# Patient Record
Sex: Female | Born: 1960 | Hispanic: Yes | Smoking: Never smoker
Health system: Southern US, Community
[De-identification: ages and names within clinical notes are randomized; demographics above are authoritative.]

## PROBLEM LIST (undated history)

## (undated) DIAGNOSIS — E119 Type 2 diabetes mellitus without complications: Secondary | ICD-10-CM

## (undated) DIAGNOSIS — I10 Essential (primary) hypertension: Secondary | ICD-10-CM

## (undated) DIAGNOSIS — E785 Hyperlipidemia, unspecified: Secondary | ICD-10-CM

## (undated) DIAGNOSIS — E079 Disorder of thyroid, unspecified: Secondary | ICD-10-CM

## (undated) HISTORY — DX: Disorder of thyroid, unspecified: E07.9

## (undated) HISTORY — DX: Type 2 diabetes mellitus without complications: E11.9

## (undated) HISTORY — DX: Hyperlipidemia, unspecified: E78.5

## (undated) HISTORY — PX: KNEE ARTHROSCOPY: SUR90

## (undated) HISTORY — DX: Essential (primary) hypertension: I10

---

## 2020-05-23 ENCOUNTER — Other Ambulatory Visit: Payer: Self-pay | Admitting: Obstetrics and Gynecology

## 2020-05-23 DIAGNOSIS — Z1231 Encounter for screening mammogram for malignant neoplasm of breast: Secondary | ICD-10-CM

## 2020-06-21 ENCOUNTER — Ambulatory Visit: Payer: Self-pay

## 2020-08-02 ENCOUNTER — Other Ambulatory Visit: Payer: Self-pay | Admitting: *Deleted

## 2020-08-02 ENCOUNTER — Ambulatory Visit: Payer: Self-pay | Admitting: *Deleted

## 2020-08-02 ENCOUNTER — Ambulatory Visit
Admission: RE | Admit: 2020-08-02 | Discharge: 2020-08-02 | Disposition: A | Discharge status: Home / Self Care | Payer: No Typology Code available for payment source | Source: Ambulatory Visit | Attending: Obstetrics and Gynecology | Admitting: Obstetrics and Gynecology

## 2020-08-02 ENCOUNTER — Other Ambulatory Visit: Payer: Self-pay

## 2020-08-02 VITALS — BP 158/98 | Wt 231.1 lb

## 2020-08-02 DIAGNOSIS — Z1231 Encounter for screening mammogram for malignant neoplasm of breast: Secondary | ICD-10-CM

## 2020-08-02 DIAGNOSIS — Z1239 Encounter for other screening for malignant neoplasm of breast: Secondary | ICD-10-CM

## 2020-08-02 DIAGNOSIS — Z124 Encounter for screening for malignant neoplasm of cervix: Secondary | ICD-10-CM

## 2020-08-02 MED ORDER — FLUCONAZOLE 150 MG PO TABS: 150.0000 mg | ORAL_TABLET | ORAL | 0 refills | Status: DC

## 2020-08-02 MED ORDER — NYSTATIN-TRIAMCINOLONE 100000-0.1 UNIT/GM-% EX OINT: 1.0000 "application " | TOPICAL_OINTMENT | Freq: Two times a day (BID) | CUTANEOUS | 0 refills | Status: DC

## 2020-08-02 NOTE — Addendum Note (Signed)
Addended by: Narda Rutherford on: 08/02/2020 02:52 PM   Modules accepted: Orders

## 2020-08-02 NOTE — Patient Instructions (Addendum)
Explained breast self awareness with Ranelle Oyster. Pap smear completed. Let her know [facility] will cover Pap smears and HPV typing every 5 years unless has a history of abnormal Pap smears. Referred patient to the [facility] for a screening mammogram on mobile unit. Appointment scheduled Thursday, Aug 02, 2020 at 1440. Patient escorted to the mobile unit following B[facility]CP appointment for her screening mammogram. Let patient know will follow up with her within the next week with results of her Pap smear and wet prep by phone. Referred patient to the [facility] program due to elevated BP and history of diabetes that patient has need seen a provider in the past three years. Informed patient that the Breast Center will follow-up with her within the next couple of weeks with results of her mammogram by letter or phone. Ranelle Oyster verbalized understanding.  Milledge Gerding, Kathaleen Maser, RN 1:52 PM

## 2020-08-02 NOTE — Progress Notes (Addendum)
Christina Norton is a 60 y.o. No obstetric history on file. female who presents to [facility] clinic today with complaint of redness and itching under bilateral breasts.    Pap Smear: Pap smear completed today. Last Pap smear was in 2018 at a clinic in [place] and was normal per patient. 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 Patient stated she had a colposcopy and cryotherapy completed for follow-up in [place]. Patient stated that she has had at least three normal Pap smears since cryotherapy. Last Pap smear result is not available in Epic.   Physical exam: Breasts Breasts symmetrical. Redness and yeast bilateral lower breasts and axilla that is greater within the right lower breast. Prescription for Nystatin and Diflucin sent to pharmacy to treat yeast. No nipple retraction bilateral breasts. No nipple discharge bilateral breasts. No lymphadenopathy. No lumps palpated bilateral breasts. No complaints of pain or tenderness on exam.      Pelvic/Bimanual Ext Genitalia Redness and yeast observed external genitalia, no swelling and urine observed on external genitalia.        Vagina Vagina reddened bleeds easily. No lesions and thick yellowish/whitish discharge observed in vagina. Wet prep completed.       Cervix Cervix is present. Cervix pink and of normal texture. No discharge observed.    Uterus Uterus is present and palpable. Uterus in normal position and normal size.        Adnexae Bilateral ovaries present and palpable. No tenderness on palpation.         Rectovaginal No rectal exam completed today since patient had no rectal complaints. No skin abnormalities observed on exam.     Smoking History: Patient has never smoked.   Patient Navigation: Patient education provided. Access to services provided for patient through [facility]. Spanish interpreter Natale Lay from [facility] provided.   Colorectal Cancer  Screening: Per patient has never had colonoscopy completed. No complaints today.    Breast and Cervical Cancer Risk Assessment: Patient does not have family history of breast cancer, known genetic mutations, or radiation treatment to the chest before age 53. Per patient has history of cervical dysplasia. Patient has no history of being immunocompromised or DES exposure in-utero.  Risk Assessment    Risk Scores      08/02/2020   Last edited by: Priscille Heidelberg, RN   5-year risk: 1 %   Lifetime risk: 6 %          A: B[facility]CP exam with pap smear No complaints.  P: Referred patient to the [facility] of [facility] for a screening mammogram on mobile unit. Appointment scheduled Thursday, Aug 02, 2020 at 1440.   Priscille Heidelberg, RN 08/02/2020 1:51 PM

## 2020-08-03 ENCOUNTER — Telehealth: Payer: Self-pay

## 2020-08-03 ENCOUNTER — Other Ambulatory Visit: Payer: Self-pay

## 2020-08-03 LAB — CERVICOVAGINAL ANCILLARY ONLY
Bacterial Vaginitis (gardnerella): POSITIVE — AB
Candida Glabrata: NEGATIVE
Candida Vaginitis: NEGATIVE
Comment: NEGATIVE
Comment: NEGATIVE
Comment: NEGATIVE
Comment: NEGATIVE
Trichomonas: NEGATIVE

## 2020-08-03 MED ORDER — METRONIDAZOLE 500 MG PO TABS: 500.0000 mg | ORAL_TABLET | Freq: Two times a day (BID) | ORAL | 0 refills | Status: DC

## 2020-08-03 NOTE — Telephone Encounter (Signed)
Via Delorise Royals, Spanish Interpreter [facility]), Patient informed wet prep results, positive for Bacterial Vaginitis, needs to rx metronidazole, 1 po bid x 1 week. Patient verbalized understanding. Rx sent to pharmacy [facility]

## 2020-08-07 ENCOUNTER — Other Ambulatory Visit: Payer: Self-pay | Admitting: Obstetrics and Gynecology

## 2020-08-07 DIAGNOSIS — R928 Other abnormal and inconclusive findings on diagnostic imaging of breast: Secondary | ICD-10-CM

## 2020-08-07 LAB — CYTOLOGY - PAP
Comment: NEGATIVE
Diagnosis: NEGATIVE
High risk HPV: NEGATIVE

## 2020-08-23 ENCOUNTER — Other Ambulatory Visit: Payer: Self-pay

## 2020-08-23 ENCOUNTER — Ambulatory Visit
Admission: RE | Admit: 2020-08-23 | Discharge: 2020-08-23 | Disposition: A | Discharge status: Home / Self Care | Payer: No Typology Code available for payment source | Source: Ambulatory Visit | Attending: Obstetrics and Gynecology | Admitting: Obstetrics and Gynecology

## 2020-08-23 ENCOUNTER — Other Ambulatory Visit: Payer: Self-pay | Admitting: Obstetrics and Gynecology

## 2020-08-23 DIAGNOSIS — R928 Other abnormal and inconclusive findings on diagnostic imaging of breast: Secondary | ICD-10-CM

## 2020-08-23 DIAGNOSIS — N6489 Other specified disorders of breast: Secondary | ICD-10-CM

## 2020-08-29 ENCOUNTER — Other Ambulatory Visit: Payer: Self-pay

## 2020-08-29 ENCOUNTER — Inpatient Hospital Stay: Payer: Self-pay | Attending: Obstetrics and Gynecology | Admitting: *Deleted

## 2020-08-29 VITALS — BP 160/111 | Ht 65.0 in | Wt 229.2 lb

## 2020-08-29 DIAGNOSIS — Z0000 Encounter for general adult medical examination without abnormal findings: Secondary | ICD-10-CM

## 2020-08-29 NOTE — Progress Notes (Signed)
Wisewoman initial screening   Interpreter- Natale Lay, [place]   Clinical Measurement:  Vitals:   08/29/20 0835 08/29/20 1048  BP: (!) 164/110 (!) 160/111   Fasting Labs Drawn Today, will review with patient when they result.   Medical History:  Patient states that she has high cholesterol, has high blood pressure and she has diabetes.  Medications:  Patient states that she does not take medication to lower cholesterol, blood pressure or blood sugar.  Patient does not take an aspirin a day to help prevent a heart attack or stroke.    Blood pressure, self measurement: Patient states that she does not measure blood pressure from home. She checks her blood pressure N/A. She shares her readings with a health care provider: N/A.   Nutrition: Patient states that on average she eats 1 cups of fruit and 1 cups of vegetables per day. Patient states that she does not eat fish at least 2 times per week. Patient eats less than half servings of whole grains. Patient drinks less than 36 ounces of beverages with added sugar weekly: yes. Patient is currently watching sodium or salt intake: yes. In the past 7 days patient has consumed drinks containing alcohol on 1 days. On a day that patient consumes drinks containing alcohol on average 2 drinks are consumed.      Physical activity:  Patient states that she gets 0 minutes of moderate and 0 minutes of vigorous physical activity each week.  Smoking status:  Patient states that she has has never smoked .   Quality of life:  Over the past 2 weeks patient states that she had little interest or pleasure in doing things: several days. She has been feeling down, depressed or hopeless:not at all.    Risk reduction and counseling:   Heart Wise: Explained Heart Wise program to patient. Obtained consent form. Showed patient how to log BP on tracking sheets. Showed patient how to use blood pressure monitor. Patient then demonstrated how to check blood pressure. Gave  patient educational brochures on hypertension.   Health Coaching: Spoke with patient about daily recommendations for fruits and vegetables. Explained to patient what heart healthy fish are and whole grains and how they can help lower cholesterol levels. Patient has not been exercising regularly. Encouraged patient to exercise when she can. She is currently having some issues with her knees. Spoke with patient about low-intensity workouts that she can do that will not put too much strain on her knees.    Navigation:  I will notify patient of lab results.  Patient is aware of 2 more health coaching sessions and a follow up. Will cal patient with follow-up appointment information for Internal Medicine once appointment is scheduled for elevated BP.  Time: 30 minutes

## 2020-08-30 ENCOUNTER — Telehealth: Payer: Self-pay

## 2020-08-30 LAB — GLUCOSE, RANDOM: Glucose: 291 mg/dL — ABNORMAL HIGH (ref 65–99)

## 2020-08-30 LAB — LIPID PANEL
Chol/HDL Ratio: 5.5 ratio — ABNORMAL HIGH (ref 0.0–4.4)
Cholesterol, Total: 238 mg/dL — ABNORMAL HIGH (ref 100–199)
HDL: 43 mg/dL (ref >39)
LDL Chol Calc (NIH): 157 mg/dL — ABNORMAL HIGH (ref 0–99)
Triglycerides: 205 mg/dL — ABNORMAL HIGH (ref 0–149)
VLDL Cholesterol Cal: 38 mg/dL (ref 5–40)

## 2020-08-30 LAB — HEMOGLOBIN A1C
Est. average glucose Bld gHb Est-mCnc: 306 mg/dL
Hgb A1c MFr Bld: 12.3 % — ABNORMAL HIGH (ref 4.8–5.6)

## 2020-08-30 NOTE — Progress Notes (Signed)
I have sent over a referral to Internal Medicine to get her scheduled for follow-up appointment. I will call the patient once the appointment is scheduled. Thanks!

## 2020-08-30 NOTE — Telephone Encounter (Signed)
Via Joslyn Hy, Patient informed negative Pap/HPV results, next pap due in 3 years. Patient verbalized understanding.

## 2020-09-05 ENCOUNTER — Telehealth: Payer: Self-pay

## 2020-09-05 NOTE — Telephone Encounter (Signed)
Health coaching 2   interpreter- [address] [employer], [phone]   Labs-238 cholesterol, 157 LDL cholesterol, 205 triglycerides, 43 HDL cholesterol, 12.3 hemoglobin A1C, 291 mean plasma glucose.  Patient understands and is aware of her lab results.   Goals- Spoke in detail with patient about lab results and answered any questions that the patient had regarding results.  Heart Wise: Checked in with patient about blood pressure readings. Patient has been taking daily readings. Patient stated that BP is still elevated in the 140's-160's. Told patient to take BP tracking logs with her to follow-up appointment for the provider to review.   Health Coaching: Reduce the amount of fried and fatty foods consumed. Try to grill, bake, broil or sautee foods instead. Reduce the amount of red meats consumed. Substitute for lean proteins like chicken or fish. Encouraged patient to try and add more whole grains and heart healthy fish in diet. Reduce the amount of sweets and sugars consumed. Reduce the amount of carbs consumed. Encouraged patient to practice diabetic diet (explained to patient what this means). Patient has been walking for 30 minutes daily since initial visit. Encouraged her to continue with daily walking.    Navigation:  Patient is aware of 1 more health coaching sessions and a follow up. Will call patient with follow-up appointment information once appointment is scheduled with Internal Medicine (currently waiting on new resident schedule for July).  Time- 23 minutes

## 2020-09-17 ENCOUNTER — Ambulatory Visit (INDEPENDENT_AMBULATORY_CARE_PROVIDER_SITE_OTHER): Payer: Self-pay | Admitting: Internal Medicine

## 2020-09-17 ENCOUNTER — Encounter: Payer: Self-pay | Admitting: Internal Medicine

## 2020-09-17 ENCOUNTER — Other Ambulatory Visit: Payer: Self-pay

## 2020-09-17 VITALS — BP 155/77 | HR 92 | Temp 98.2°F | Ht 65.0 in | Wt 230.6 lb

## 2020-09-17 DIAGNOSIS — E039 Hypothyroidism, unspecified: Secondary | ICD-10-CM

## 2020-09-17 DIAGNOSIS — E785 Hyperlipidemia, unspecified: Secondary | ICD-10-CM

## 2020-09-17 DIAGNOSIS — I10 Essential (primary) hypertension: Secondary | ICD-10-CM

## 2020-09-17 DIAGNOSIS — E119 Type 2 diabetes mellitus without complications: Secondary | ICD-10-CM

## 2020-09-17 MED ORDER — ATORVASTATIN CALCIUM 20 MG PO TABS: 20.0000 mg | ORAL_TABLET | Freq: Every day | ORAL | 2 refills | Status: DC

## 2020-09-17 MED ORDER — LISINOPRIL-HYDROCHLOROTHIAZIDE 20-12.5 MG PO TABS: 1.0000 | ORAL_TABLET | Freq: Every day | ORAL | 2 refills | Status: DC

## 2020-09-17 MED ORDER — METFORMIN HCL 500 MG PO TABS: ORAL_TABLET | ORAL | 0 refills | Status: DC

## 2020-09-17 NOTE — Patient Instructions (Signed)
Gracias por visitar la [employer] [facility] hoy. Fue Psychiatrist conocerte!  Hoy discutimos su diabetes, presin arterial alta y colesterol alto. Tambin discutimos su hipotiroidismo. 1. Diabetes: Comenzar a tomar metformina a una tasa titulada:  un. Semana 1: 500 mg una vez al da  b. Semana 2: 500 mg dos veces al da  c. Semana 3: 1000 mg dos veces al da 2. Presin arterial alta: Se ha enviado un medicamento llamado lisinopril-hidroclorotiazida. Lo tomar Pollyann Savoy al da. 3. Colesterol alto: Se ha enviado un medicamento llamado atorvastatina. Lo tomar Pollyann Savoy al da. 4. Hipotiroidismo: Antes de enviar medicamentos para su tiroides, me gustara recibir [employer] de su anlisis de sangre realizado en su visita de hoy. Una vez que me los [facility], lo llamar y le har saber cules son los prximos pasos en la gestin.   Me gustara verlo de vuelta en la clnica en un mes para asegurarme de que pudo obtener estos medicamentos y que est bien con ellos.  Por favor, no dude en ponerse en contacto con la clnica con cualquier pregunta o inquietud! Dra. August Saucer

## 2020-09-17 NOTE — Assessment & Plan Note (Addendum)
Labs 06/22/22022 showed: Total cholesterol 238 Triglycerides 205 HDL 43 VLDL 38 LDL 157 Total cholesterol/HDL 5.5  ASCVD risk calculated to be 10.3%. - Begin atorvastatin 20 mg by mouth once daily. - Follow-up in one month.

## 2020-09-17 NOTE — Assessment & Plan Note (Addendum)
Patient reports past treatment for hypothyroidism with levothyroxine 25 mcg.  - TSH ordered - Will send prescription for levothyroxine pending lab results  Addendum: TSH is WNL. Will send in prescription for patient's previous dose of levothyroxine 25 mcg.

## 2020-09-17 NOTE — Progress Notes (Signed)
Patient ID: Christina Norton, female   DOB: 1960/04/17, 60 y.o.   MRN: [id number]   CC: follow-up for abnormal labs from [facility] Woman visit  HPI:  [name] is a 60 y.o. female with a past medical history of diabetes mellitus, hypertension, and thyroid disease presenting for follow-up from [facility] appointment.  The patient states that she has been treated in the past for diabetes and that this began when she was living in [place]. She states that she had previously been taking metformin 1000 mg by mouth once daily. She has never taken other medications including insulin for diabetes management. She endorses weight loss of 2-3 kg from eating a healthier diet, frequency of urination with increased nocturia, fatigue, increased hunger, occasional shortness of breath, headache, muscle aches. She denies increased thirst, GI upset, confusion, and fruity odor to her breath. Labs 08/29/2020 showed Hb A1c of 12.3% and POC blood glucose of 291.  She denies previous prescription management for hypertension, stating that she took a more homeopathic approach. She has been keeping a home log of once-weekly BP readings which ranged from 150s-160s/70s-90s. She does endorse headaches that are worse upon waking and felt mainly in the back of her head and into her neck. She denies any sudden onset weakness, confusion, difficulties with speech, or chest pain. She does state that sometimes at night she will wake with a cough. She sleeps with the head of her bed elevated.  She states that she was previously treated for hypothyroidism with levothyroxine 25 mcg. She denies cold or heat intolerance, difficulty gaining weight, dry hair or skin. She endorses dry nails, brain fog.  She denies previous treatment for hyperlipidemia. Labs 08/29/2020 showed Total cholesterol 238 Triglycerides 205 HDL 43 LDL 157 Total cholesterol/HDL 5.5  ASCVD risk calculation: 10.3%.  Past Medical History:   Diagnosis Date   Diabetes mellitus without complication (HCC)    Hypertension    Thyroid disease    Review of Systems:  Review of Systems  Constitutional:  Positive for malaise/fatigue and weight loss.  HENT:  Positive for congestion.   Respiratory:  Positive for cough (at night) and shortness of breath ("at times").   Cardiovascular:  Positive for orthopnea. Negative for chest pain and leg swelling.  Gastrointestinal:  Negative for abdominal pain, constipation and diarrhea.  Genitourinary:  Positive for frequency.  Musculoskeletal:  Positive for neck pain.  Neurological:  Positive for headaches. Negative for speech change, focal weakness and weakness.  Psychiatric/Behavioral:  Negative for memory loss. The patient does not have insomnia.     Physical Exam:  Vitals:   09/17/20 1507 09/17/20 1511  BP: (!) 177/67 (!) 155/77  Pulse: 99 92  Temp: 98.2 F (36.8 C)   TempSrc: Oral   SpO2: 98%   Weight: 230 lb 9.6 oz (104.6 kg)   Height: 5\' 5"  (1.651 m)    Physical Exam Constitutional:      Appearance: Normal appearance. She is overweight.  Cardiovascular:     Rate and Rhythm: Normal rate and regular rhythm.     Pulses:          Radial pulses are 2+ on the right side and 2+ on the left side.       Posterior tibial pulses are 2+ on the right side and 2+ on the left side.     Heart sounds: Normal heart sounds.  Pulmonary:     Effort: Pulmonary effort is normal.     Breath sounds: Normal breath sounds.  Abdominal:     Palpations: Abdomen is soft.     Tenderness: There is no abdominal tenderness.  Musculoskeletal:     Right lower leg: No edema.     Left lower leg: No edema.  Skin:    General: Skin is warm and dry.  Neurological:     General: No focal deficit present.  Psychiatric:        Mood and Affect: Mood and affect normal.        Behavior: Behavior is cooperative.        Judgment: Judgment normal.     Assessment & Plan:   See Encounters Tab for problem based  charting.  Patient seen with Dr. Oswaldo Done

## 2020-09-17 NOTE — Assessment & Plan Note (Addendum)
Labs 08/29/2020 showed HbA1c of 12.3% and POC blood glucose of 291. Patient has been treated in the past for diabetes and was taking metformin 1000 mg once per day - Restart metformin and titrate:  Week 1: 500 mg by mouth once daily  Week 2: 500 mg by mouth twice daily  Week 3: 1000 mg by mouth twice daily - Follw-up in 1 month

## 2020-09-17 NOTE — Assessment & Plan Note (Signed)
Vital signs show consistently elevated BP. In clinic reading showed 155/77. - BMP ordered - Begin lisinopril-hydrochlorothiazide 20 mg-12.5 mg - Follow-up in one month

## 2020-09-18 LAB — BMP8+ANION GAP
Anion Gap: 15 mmol/L (ref 10.0–18.0)
BUN/Creatinine Ratio: 24 — ABNORMAL HIGH (ref 9–23)
BUN: 12 mg/dL (ref 6–24)
CO2: 25 mmol/L (ref 20–29)
Calcium: 9.4 mg/dL (ref 8.7–10.2)
Chloride: 96 mmol/L (ref 96–106)
Creatinine, Ser: 0.51 mg/dL — ABNORMAL LOW (ref 0.57–1.00)
Glucose: 317 mg/dL — ABNORMAL HIGH (ref 65–99)
Potassium: 4 mmol/L (ref 3.5–5.2)
Sodium: 136 mmol/L (ref 134–144)
eGFR: 108 mL/min/{1.73_m2} (ref >59)

## 2020-09-18 LAB — TSH: TSH: 1.6 u[IU]/mL (ref 0.450–4.500)

## 2020-09-19 ENCOUNTER — Telehealth: Payer: Self-pay

## 2020-09-19 NOTE — Telephone Encounter (Signed)
Spoke with patient via spanish interpreter Natale Lay, [employer] to obtain BP readings for HeartWise program. Patient has taken BP readings over the past 2 weeks. BP remains elevated. Patient started medication for BP on 09/19/20. Hopefully medication will help lower readings in the coming days. Patient is scheduled for follow-up with PCP in 1 month. Encouraged patient to keep follow-up appointment and to complete both orange card application and financial assistance application to help pay for future appointments. Reminded patient to take BP medication daily and at the same time every day. Patient voiced understanding.

## 2020-09-20 ENCOUNTER — Telehealth: Payer: Self-pay | Admitting: Internal Medicine

## 2020-09-20 MED ORDER — LEVOTHYROXINE SODIUM 25 MCG PO TABS: 25.0000 ug | ORAL_TABLET | Freq: Every day | ORAL | 2 refills | Status: DC

## 2020-09-20 NOTE — Progress Notes (Signed)
Internal Medicine Clinic Attending  I saw and evaluated the patient.  I personally confirmed the key portions of the history and exam documented by Dr.  Dean  and I reviewed pertinent patient test results.  The assessment, diagnosis, and plan were formulated together and I agree with the documentation in the resident's note.  

## 2020-09-20 NOTE — Addendum Note (Signed)
Addended by: Ihor Dow on: 09/20/2020 11:47 AM   Modules accepted: Orders

## 2020-09-20 NOTE — Telephone Encounter (Signed)
With assistance from Sue Lush [phone]) with [employer], I called the patient and left a voicemail after there was no answer letting her know that I received her results and will send in a prescription for levothyroxine. She was instructed to call the clinic if she has any questions at [phone].

## 2020-10-01 ENCOUNTER — Telehealth: Payer: Self-pay

## 2020-10-01 NOTE — Telephone Encounter (Signed)
Requesting to speak with a nurse, please call back.  

## 2020-10-01 NOTE — Telephone Encounter (Signed)
TC placed to language line/[employer], Poor connection.  RN will try call again later this afternoon. SChaplin, RN,BSN

## 2020-10-01 NOTE — Telephone Encounter (Signed)
RTC to patient with the assistance of the language line/[employer] # [phone].  Patient states she was seen in clinic on 09/17/20 and had labwork done.  She states "the doctor told me that I would only be charged $20-$30 for the labwork, but I just received a bill for $161 from [facility]".  RN informed patient she is speaking with a nurse triage line who assist patients with medical questions/emergencies.  Patient asking for someone in our clinic to call her back about why her bill is more than what she was told it would be.  RN informed patient she will forward to [facility] office manager.  RN also encouraged patient to call [facility]. SChaplin, RN,BSN

## 2020-10-26 ENCOUNTER — Other Ambulatory Visit: Payer: Self-pay | Admitting: Internal Medicine

## 2020-10-26 MED ORDER — METFORMIN HCL 500 MG PO TABS: ORAL_TABLET | ORAL | 0 refills | Status: DC

## 2020-10-26 NOTE — Telephone Encounter (Signed)
Refill Request-   Pt requesting to use a new Pharmacy-   lisinopril-hydrochlorothiazide (ZESTORETIC) 20-12.5 MG tablet metFORMIN (GLUCOPHAGE) 500 MG tablet (Expired   atorvastatin (LIPITOR) 20 MG tablet  [facility]  [address] Address: [address] [place], [place], [place] [address] Phone: [phone] Phone: [phone]

## 2020-10-26 NOTE — Telephone Encounter (Signed)
Patient has 2 refills on lisinopril-HCTZ and atorvastatin. Spoke with [place] Sink at [place]. She will get these meds ready for patient and place note asking her to call pharmacy first for refills.   Will forward request for metformin. Please adjust Sig. Thank you.

## 2020-10-29 ENCOUNTER — Telehealth: Payer: Self-pay

## 2020-10-29 NOTE — Telephone Encounter (Signed)
Health Coaching 3  interpreter- Natale Lay, [facility]   Goals- Patient has reduced the amount canned foods that she consumes. Patient has also increased the amount of fruit that she consumes. Patient has been consuming 2 servings of fruit a day. Patient has also been eating a salad a day for lunch. Patient has switched to whole grain pastas when she does consume pasta. Patient has also tried to increase the amount of heart healthy fish that she consumes.  Patient has been walking 3 days a week for 30-45 minutes.   New goal- Increase exercise to 5 days a week for 30-45 minutes.   Barrier to reaching goal- NA   Strategies to overcome- NA  Heart Wise: Obtained remaining blood pressure readings for patient. Patient has completed all tracking at this time for HeartWise.   Navigation:  Patient is aware of  a follow up session. Patient is scheduled for tele-visit follow-up visit on 11/21/20 @ 2:45 pm.   Time- 10 minutes

## 2020-11-21 ENCOUNTER — Other Ambulatory Visit: Payer: Self-pay

## 2020-11-21 ENCOUNTER — Ambulatory Visit: Payer: No Typology Code available for payment source

## 2020-11-21 ENCOUNTER — Inpatient Hospital Stay: Payer: Self-pay | Attending: Obstetrics and Gynecology | Admitting: *Deleted

## 2020-11-21 VITALS — BP 136/84 | Ht 65.0 in | Wt 229.2 lb

## 2020-11-21 DIAGNOSIS — Z0000 Encounter for general adult medical examination without abnormal findings: Secondary | ICD-10-CM

## 2020-11-21 NOTE — Progress Notes (Signed)
Wisewoman follow up *Tele-Visit (BP Readings and Weight obtained by patient at home with monitor and scale given to patient through the HeartWise program.    Interpreter: Natale Lay, [employer]  Clinical Measurement:   Vitals:   11/21/20 1102  BP: 136/82      Medical History:  Patient states that she has high cholesterol, has high blood pressure and she has diabetes.  Medications:  Patient states that she does take medication to lower cholesterol, blood pressure and blood sugar.  Patient does take an aspirin a day to help prevent a heart attack or stroke. During the past 7 days patient has taken prescribed medication to lower cholesterol, blood pressure and blood sugar on 7 days.   Blood pressure, self measurement:  Patient states that she does measure blood pressure from home. She checks her blood pressure weekly. She shares her readings with a health care provider: no.   Nutrition: Patient states that on average she eats 2 cups of fruit and 2 cups of vegetables per day. Patient states that she does eat fish at least 2 times per week. Patient eats less than half servings of whole grains. Patient drinks less than 36 ounces of beverages with added sugar weekly: yes. Patient is currently watching sodium or salt intake: yes. In the past 7 days patient has had 1 drinks containing alcohol. On average patient drinks 3 drinks containing alcohol per day.      Physical activity:  Patient states that she gets 210 minutes of moderate and 0 minutes of vigorous physical activity each week.  Smoking status:  Patient states that she has has never smoked .   Quality of life:  Over the past 2 weeks patient states that she had little interest or pleasure in doing things: not at all. She has been feeling down, depressed or hopeless:not at all.    Risk reduction and counseling:   Health Coaching: Patient has increased heart healthy fish intake to 4 servings a week and has eliminated red meats from her diet.  Patient has increased her daily fruit and vegetable intake as well. Patient has been walking 3-4 times a week for an hour at a time. Encouraged patient to continue with exercise.  Spoke with patient about continuing to watch the amount of fried and fatty foods that she consumes due to elevated cholesterol during initial screening. Encouraged patient to also continue watching the amount of sweets and sugars that she consumes in both food and beverages as well as carbohydrates due to elevated glucose and hemoglobin A1C during initial screening. Encouraged patient to also continue watching the amount of salt that she consumes.   Heart Wise: Encouraged patient to continue using BP monitor at home to check blood pressure and to follow-up with PCP for elevated readings. Encouraged patient to continue taking BP medication daily and at the same time every day. Patient voiced understanding.     Navigation: This was the  follow up session for this patient, I will check up on her progress in the coming months.  Time: 30 minutes

## 2020-12-06 ENCOUNTER — Other Ambulatory Visit: Payer: Self-pay | Admitting: *Deleted

## 2020-12-06 NOTE — Telephone Encounter (Signed)
Received call from patient via Helmut Muster with [facility] [employer] ID [phone]. Requesting refill on lisinopril-HCTZ and metformin at [facility] on [address]. Patient states she was taking 3 tabs metformin (1500 mg total once daily. Instructed her to begin taking 2 tabs BID with meals. States she will.

## 2020-12-07 MED ORDER — LISINOPRIL-HYDROCHLOROTHIAZIDE 20-12.5 MG PO TABS: 1.0000 | ORAL_TABLET | Freq: Every day | ORAL | 2 refills | Status: DC

## 2020-12-07 MED ORDER — METFORMIN HCL 500 MG PO TABS: 1000.0000 mg | ORAL_TABLET | Freq: Two times a day (BID) | ORAL | 1 refills | Status: DC

## 2020-12-12 ENCOUNTER — Ambulatory Visit: Payer: Self-pay | Admitting: Internal Medicine

## 2020-12-12 ENCOUNTER — Encounter: Payer: Self-pay | Admitting: Internal Medicine

## 2020-12-12 ENCOUNTER — Other Ambulatory Visit: Payer: Self-pay

## 2020-12-12 VITALS — BP 120/75 | HR 87 | Temp 98.5°F | Resp 32 | Ht 65.0 in | Wt 229.4 lb

## 2020-12-12 DIAGNOSIS — Z0000 Encounter for general adult medical examination without abnormal findings: Secondary | ICD-10-CM

## 2020-12-12 DIAGNOSIS — E785 Hyperlipidemia, unspecified: Secondary | ICD-10-CM

## 2020-12-12 DIAGNOSIS — E119 Type 2 diabetes mellitus without complications: Secondary | ICD-10-CM

## 2020-12-12 DIAGNOSIS — I10 Essential (primary) hypertension: Secondary | ICD-10-CM

## 2020-12-12 DIAGNOSIS — Z01419 Encounter for gynecological examination (general) (routine) without abnormal findings: Secondary | ICD-10-CM | POA: Insufficient documentation

## 2020-12-12 LAB — GLUCOSE, CAPILLARY: Glucose-Capillary: 277 mg/dL — ABNORMAL HIGH (ref 70–99)

## 2020-12-12 LAB — POCT GLYCOSYLATED HEMOGLOBIN (HGB A1C): Hemoglobin A1C: 11.7 % — AB (ref 4.0–5.6)

## 2020-12-12 MED ORDER — RYBELSUS 3 MG PO TABS
3.0000 mg | ORAL_TABLET | Freq: Every day | ORAL | 0 refills | Status: DC
  Filled 2020-12-12: qty 30, fill #0

## 2020-12-12 MED ORDER — RYBELSUS 3 MG PO TABS: 3.0000 mg | ORAL_TABLET | Freq: Every day | ORAL | 0 refills | Status: DC

## 2020-12-12 MED ORDER — EMPAGLIFLOZIN 25 MG PO TABS: 25.0000 mg | ORAL_TABLET | Freq: Every day | ORAL | 2 refills | Status: DC

## 2020-12-12 MED ORDER — EMPAGLIFLOZIN 25 MG PO TABS
25.0000 mg | ORAL_TABLET | Freq: Every day | ORAL | 2 refills | Status: DC
  Filled 2020-12-12 (×2): qty 30, 30d supply, fill #0

## 2020-12-12 NOTE — Assessment & Plan Note (Signed)
Patient started on statin at previous visit for ASCVD risk of 10.3%. patient reports that she has been taking this as prescribed.  Plan: Repeat lipid panel today  Continue atorvastatin 20mg  daily, uptitrate for goal LDL<100

## 2020-12-12 NOTE — Assessment & Plan Note (Signed)
BP Readings from Last 3 Encounters:  12/12/20 120/75  11/21/20 136/84  09/17/20 (!) 155/77   Patient presenting for follow up of her hypertension. She was noted to be hypertensive at last visit for which she was started on lisinopril-HCTZ 20-12.5mg  daily. She reports medication adherence and low sodium diet. She has been checking her BP at home and notes systolic ranges 137-139. BP in office 143/70, repeat 120/75.  Patient denies any headaches, vision changes, lightheadedness/dizziness or focal weakness.   Plan: Continue current regimen  BMP at this visit

## 2020-12-12 NOTE — Patient Instructions (Addendum)
Sra Ian Bushman,  Fue un placer verte en la clnica. Hoy discutimos:  Hipertensin: Contine tomando sus medicamentos segn lo prescrito. No hay cambios en la medicacin en este momento.  Diabetes: su A1c es 11.7 en esta visita (la meta es menos de 7). En este momento, aumente su metformina a la dosis mxima tolerada (se recomiendan 1000 mg [employer]). Tambin le estoy dando un segundo medicamento para su diabetes. Por favor, tome esto una vez al [employer]. Por favor, haga un seguimiento en 4 semanas.  Tambin estoy revisando algunos trabajos de laboratorio hoy. Te llamar con cualquier resultado anormal.  Si tiene alguna pregunta o inquietud, llame a nuestra clnica al [phone] [place] 9 a. m. y las 5 p. m. y despus del horario de atencin llame al [phone] y pregunte por el residente de [place] interna de [place]. Si cree que tiene Engineer, drilling, llame al 911.  Gracias, esperamos poder ayudarlo a mantenerse saludable!  Si no te has puesto la vacuna del COVID, te recomiendo hacerlo: Puede obtenerlo en su [facility] o [facility] local O Para programar una cita para una vacuna COVID o ser agregado a la lista de espera de vacunas: Vaya a [email or web address]   Virgie Dad a [email or web address]                  O Llame al [phone]                                     Venda Rodes al [phone] y seleccione la Opcin 2

## 2020-12-12 NOTE — Assessment & Plan Note (Signed)
Patient requesting referral to gynecology at this time. No specific concerns.   Plan: Referral to gynecology placed per patient preference

## 2020-12-12 NOTE — Assessment & Plan Note (Addendum)
HbA1c at this visit 11.7. Patient is currently taking metformin 500mg  twice daily. She also notes making dietary changes to which she is only eating whole grains, fruits, vegetables and lean meats.  Denies any polyuria or polydipsia.  Given that her A1c is still above goal, will uptitrate metformin to maximum tolerated dosing and addition of second agent. Patient has orange card and rybelsus is not covered at this time. Will add SGLT-2i to medication regimen.  Plan: Uptitrate metformin to 1000mg  twice daily Start Jardiance 25mg  daily  Referral to ophthalmology for diabetic eye exam Foot exam completed at this visit F/u in 1 month

## 2020-12-12 NOTE — Progress Notes (Signed)
   CC: diabetes and hypertension follow up  HPI:  [name] is a 60 y.o. female with PMHx as stated below presenting for follow up of her diabetes and hypertension. No acute concerns at this time. Please see problem based charting for complete assessment and plan.  Past Medical History:  Diagnosis Date   Diabetes mellitus without complication (HCC)    Hypertension    Thyroid disease    Review of Systems:  Negative except as stated in HPI.   Physical Exam:  Vitals:   12/12/20 1403  BP: (!) 143/70  Pulse: 94  Resp: (!) 32  Temp: 98.5 F (36.9 C)  TempSrc: Oral  SpO2: 98%  Weight: 229 lb 6.4 oz (104.1 kg)  Height: 5\' 5"  (1.651 m)   Physical Exam  Constitutional: Middle aged obese female; No distress.  Cardiovascular: Normal rate, regular rhythm, S1 and S2 present, no murmurs, rubs, gallops.  Distal pulses intact Respiratory: No respiratory distress, no accessory muscle use.  Effort is normal.  Lungs are clear to auscultation bilaterally. GI: Nondistended, soft, nontender to palpation, normal bowel sounds Musculoskeletal: Normal bulk and tone.  No peripheral edema noted. Neurological: Is alert and oriented x4, no apparent focal deficits noted. Skin: Warm and dry.  No rash, erythema, lesions noted. Psychiatric: Normal mood and affect. Behavior is normal. Judgment and thought content normal.   Assessment & Plan:   See Encounters Tab for problem based charting.  Patient discussed with Dr.  

## 2020-12-13 LAB — BMP8+ANION GAP
Anion Gap: 19 mmol/L — ABNORMAL HIGH (ref 10.0–18.0)
BUN/Creatinine Ratio: 17 (ref 9–23)
BUN: 11 mg/dL (ref 6–24)
CO2: 21 mmol/L (ref 20–29)
Calcium: 9.6 mg/dL (ref 8.7–10.2)
Chloride: 98 mmol/L (ref 96–106)
Creatinine, Ser: 0.63 mg/dL (ref 0.57–1.00)
Glucose: 241 mg/dL — ABNORMAL HIGH (ref 70–99)
Potassium: 4 mmol/L (ref 3.5–5.2)
Sodium: 138 mmol/L (ref 134–144)
eGFR: 103 mL/min/{1.73_m2} (ref >59)

## 2020-12-13 LAB — LIPID PANEL
Chol/HDL Ratio: 4.4 ratio (ref 0.0–4.4)
Cholesterol, Total: 175 mg/dL (ref 100–199)
HDL: 40 mg/dL (ref >39)
LDL Chol Calc (NIH): 105 mg/dL — ABNORMAL HIGH (ref 0–99)
Triglycerides: 169 mg/dL — ABNORMAL HIGH (ref 0–149)
VLDL Cholesterol Cal: 30 mg/dL (ref 5–40)

## 2020-12-13 LAB — HEPATITIS C ANTIBODY: Hep C Virus Ab: <0.1 s/co ratio (ref 0.0–0.9)

## 2020-12-13 LAB — HIV ANTIBODY (ROUTINE TESTING W REFLEX): HIV Screen 4th Generation wRfx: NONREACTIVE

## 2020-12-14 ENCOUNTER — Other Ambulatory Visit: Payer: Self-pay

## 2020-12-14 NOTE — Progress Notes (Signed)
Internal Medicine Clinic Attending  Case discussed with Dr. Mcarthur Rossetti at the time of the visit.  We reviewed the resident's history and exam and pertinent patient test results.  I agree with the assessment, diagnosis, and plan of care documented in the resident's note. Considered GLP1 as next step therapy given concomitant obesity and weight benefit, limited by patient insurance status.

## 2020-12-19 ENCOUNTER — Other Ambulatory Visit: Payer: Self-pay

## 2021-01-09 ENCOUNTER — Other Ambulatory Visit (HOSPITAL_COMMUNITY): Payer: Self-pay

## 2021-01-09 ENCOUNTER — Other Ambulatory Visit: Payer: Self-pay

## 2021-01-09 ENCOUNTER — Ambulatory Visit (INDEPENDENT_AMBULATORY_CARE_PROVIDER_SITE_OTHER): Payer: Self-pay | Admitting: Internal Medicine

## 2021-01-09 DIAGNOSIS — Z0000 Encounter for general adult medical examination without abnormal findings: Secondary | ICD-10-CM | POA: Insufficient documentation

## 2021-01-09 DIAGNOSIS — E039 Hypothyroidism, unspecified: Secondary | ICD-10-CM

## 2021-01-09 DIAGNOSIS — E119 Type 2 diabetes mellitus without complications: Secondary | ICD-10-CM

## 2021-01-09 DIAGNOSIS — I10 Essential (primary) hypertension: Secondary | ICD-10-CM

## 2021-01-09 DIAGNOSIS — E785 Hyperlipidemia, unspecified: Secondary | ICD-10-CM

## 2021-01-09 DIAGNOSIS — Z01419 Encounter for gynecological examination (general) (routine) without abnormal findings: Secondary | ICD-10-CM

## 2021-01-09 MED ORDER — EMPAGLIFLOZIN 25 MG PO TABS
25.0000 mg | ORAL_TABLET | Freq: Every day | ORAL | 2 refills | Status: DC
  Filled 2021-01-09 – 2021-02-04 (×2): qty 30, 30d supply, fill #0
  Filled 2021-03-15: qty 30, 30d supply, fill #1

## 2021-01-09 MED ORDER — ATORVASTATIN CALCIUM 20 MG PO TABS
20.0000 mg | ORAL_TABLET | Freq: Every day | ORAL | 2 refills | Status: DC
  Filled 2021-01-09 – 2021-02-04 (×2): qty 30, 30d supply, fill #0
  Filled 2021-03-15: qty 30, 30d supply, fill #1

## 2021-01-09 MED ORDER — LISINOPRIL-HYDROCHLOROTHIAZIDE 20-12.5 MG PO TABS
1.0000 | ORAL_TABLET | Freq: Every day | ORAL | 2 refills | Status: DC
  Filled 2021-01-09 – 2021-02-04 (×2): qty 30, 30d supply, fill #0
  Filled 2021-03-15: qty 30, 30d supply, fill #1

## 2021-01-09 MED ORDER — LEVOTHYROXINE SODIUM 25 MCG PO TABS
25.0000 ug | ORAL_TABLET | Freq: Every day | ORAL | 2 refills | Status: DC
  Filled 2021-01-09 – 2021-02-04 (×2): qty 30, 30d supply, fill #0
  Filled 2021-03-15: qty 30, 30d supply, fill #1

## 2021-01-09 MED ORDER — METFORMIN HCL 500 MG PO TABS
1000.0000 mg | ORAL_TABLET | Freq: Two times a day (BID) | ORAL | 1 refills | Status: DC
  Filled 2021-01-09 – 2021-02-04 (×2): qty 120, 30d supply, fill #0

## 2021-01-09 NOTE — Progress Notes (Addendum)
   CC: DM/HTN follow-up  HPI:  Ms.Aleyssa Rhyder Bratz is a 60 y.o. with PMHx as stated below presenting for follow up of her diabetes and hypertension. Please see problem based charting for complete assessment and plan.   Past Medical History:  Diagnosis Date   Diabetes mellitus without complication (HCC)    Hypertension    Thyroid disease    Review of Systems:  Review of Systems  Constitutional:  Negative for chills and fever.  HENT: Negative.    Eyes: Negative.   Respiratory:  Positive for sputum production. Negative for shortness of breath.        States that sputum production is chronic, unchanged. Denies fevers/chills/CP  Cardiovascular:  Negative for chest pain.  Gastrointestinal: Negative.   Genitourinary: Negative.   Musculoskeletal: Negative.   Skin: Negative.   Neurological: Negative.   Endo/Heme/Allergies: Negative.   Psychiatric/Behavioral: Negative.      Physical Exam: There were no vitals filed for this visit.  BP 131/79 Physical Exam Constitutional:      General: She is not in acute distress.    Appearance: Normal appearance.  HENT:     Head: Normocephalic and atraumatic.  Eyes:     Pupils: Pupils are equal, round, and reactive to light.  Cardiovascular:     Rate and Rhythm: Normal rate and regular rhythm.     Heart sounds: No murmur heard.   No friction rub. No gallop.  Pulmonary:     Effort: Pulmonary effort is normal.     Breath sounds: Normal breath sounds. No wheezing, rhonchi or rales.  Abdominal:     General: There is no distension.     Palpations: Abdomen is soft.     Tenderness: There is no abdominal tenderness.  Musculoskeletal:        General: No swelling, deformity or signs of injury.     Cervical back: Neck supple.  Skin:    General: Skin is warm and dry.  Neurological:     General: No focal deficit present.     Mental Status: She is alert and oriented to person, place, and time. Mental status is at baseline.  Psychiatric:         Mood and Affect: Mood normal.        Behavior: Behavior normal.     Assessment & Plan:   See Encounters Tab for problem based charting.  Patient seen with Dr. Oswaldo Done

## 2021-01-09 NOTE — Patient Instructions (Signed)
Thank you, Ms.Charnise Ajane Novella for allowing [place] to provide your care today. Discutimos tus medicinas de diabetes.  1) Te doy la direccion de la farmacia de [place]. Tus medicinas estan aqui.  2) La carta naranja es para el doctor de los ojos. No podemos cubrir la ortopedia en [employer].      I have ordered the following labs for you:  Lab Orders  No laboratory test(s) ordered today     Tests ordered today:  Nada  Referrals ordered today:    Referral Orders         Ambulatory referral to Ophthalmology      I have ordered the following medication/changed the following medications:   Stop the following medications: Medications Discontinued During This Encounter  Medication Reason   atorvastatin (LIPITOR) 20 MG tablet Reorder   levothyroxine (SYNTHROID) 25 MCG tablet Reorder   metFORMIN (GLUCOPHAGE) 500 MG tablet Reorder   empagliflozin (JARDIANCE) 25 MG TABS tablet Reorder     Start the following medications: Meds ordered this encounter  Medications   atorvastatin (LIPITOR) 20 MG tablet    Sig: Take 1 tablet (20 mg total) by mouth daily.    Dispense:  30 tablet    Refill:  2    IM program   empagliflozin (JARDIANCE) 25 MG TABS tablet    Sig: Take 1 tablet (25 mg total) by mouth daily.    Dispense:  30 tablet    Refill:  2    IM program   levothyroxine (SYNTHROID) 25 MCG tablet    Sig: Take 1 tablet (25 mcg total) by mouth daily before breakfast.    Dispense:  30 tablet    Refill:  2    IM program   metFORMIN (GLUCOPHAGE) 500 MG tablet    Sig: Take 2 tablets (1,000 mg total) by mouth 2 (two) times daily with a meal.    Dispense:  120 tablet    Refill:  1    IM program     Follow up: 2 months    Should you have any questions or concerns please call the internal medicine clinic at [phone].     Fredonia Highland, MD

## 2021-01-09 NOTE — Assessment & Plan Note (Signed)
Patient requesting gynecology referral due to RLQ pain.  She states that she requested this referral at her last visit, however she was not contacted for follow-up.  Per review of records, patient is in the well woman program and was seen in September for visit.  She states that the pain started after this visit.  Instructed to arrange follow-up with well woman program if needed.

## 2021-01-09 NOTE — Assessment & Plan Note (Signed)
Patient presenting for follow-up of hypertension.  She is started on both lisinopril HCTZ 20-12.5 mg daily at her last visit.  She states that she has been adherent to this regimen.  Her blood pressures at home have been in the systolics of 130s.  BP in the office of 131/79.  Denies any symptoms at this time.  P: Continue lisinopril-HCTZ 20-12.5 mg daily at this time

## 2021-01-09 NOTE — Assessment & Plan Note (Signed)
LDL 105.  She is on atorvastatin 20 mg daily for primary prevention.  P: Refilled atorvastatin 20 mg daily

## 2021-01-09 NOTE — Assessment & Plan Note (Signed)
Patient has an orange card.  She is requesting orthopedic services, however we advised the patient that this is not covered by orange card and would have to be paid out-of-pocket.  She will also need to reapply for her orange card in March 2023 after missing deadline.

## 2021-01-09 NOTE — Assessment & Plan Note (Signed)
Refilled levothyroxine 25 mcg today.

## 2021-01-09 NOTE — Assessment & Plan Note (Addendum)
A: HbA1c 11.7 from 1 month ago. Denies symptoms such as polyuria or numbness/tingling.  Patient has been taking metformin 1000 mg twice daily since her last visit.  She was also placed on Jardiance 25 mg daily, however she has not yet picked up this medication from the pharmacy.  Patient originally tried to pick Jardiance up from [place], however she does not have insurance and would cost her over 500 dollars out of pocket.    P: She has an orange card, so we sent her metformin and Jardiance to the most current outpatient pharmacy. Follow-up in 2 months.

## 2021-01-10 NOTE — Progress Notes (Signed)
Internal Medicine Clinic Attending ? ?I saw and evaluated the patient.  I personally confirmed the key portions of the history and exam documented by Dr. Bonanno and I reviewed pertinent patient test results.  The assessment, diagnosis, and plan were formulated together and I agree with the documentation in the resident?s note. ? ?

## 2021-01-11 ENCOUNTER — Telehealth: Payer: Self-pay

## 2021-01-11 NOTE — Telephone Encounter (Signed)
Copied from CRM [id number]. Topic: General - Other >> Jan 11, 2021 11:25 AM Jaquita Rector A wrote: Reason for CRM: Patient called in to spek to Mikle Bosworth say that she have questions about the [employer] and its services. Can be reached at Ph#  [phone]

## 2021-01-14 NOTE — Telephone Encounter (Signed)
I return Pt call, she was explain that her CAFA was denied until 03/20/21 and she can not reapply until therm and also that the OC program does not cover [facility] bills

## 2021-01-17 ENCOUNTER — Other Ambulatory Visit (HOSPITAL_COMMUNITY): Payer: Self-pay

## 2021-02-04 ENCOUNTER — Other Ambulatory Visit (HOSPITAL_COMMUNITY): Payer: Self-pay

## 2021-02-25 ENCOUNTER — Other Ambulatory Visit: Payer: Self-pay | Admitting: Obstetrics and Gynecology

## 2021-02-25 ENCOUNTER — Ambulatory Visit
Admission: RE | Admit: 2021-02-25 | Discharge: 2021-02-25 | Disposition: A | Discharge status: Home / Self Care | Payer: No Typology Code available for payment source | Source: Ambulatory Visit | Attending: Obstetrics and Gynecology | Admitting: Obstetrics and Gynecology

## 2021-02-25 DIAGNOSIS — N6489 Other specified disorders of breast: Secondary | ICD-10-CM

## 2021-03-05 ENCOUNTER — Other Ambulatory Visit: Payer: Self-pay

## 2021-03-05 ENCOUNTER — Other Ambulatory Visit (HOSPITAL_COMMUNITY): Payer: Self-pay

## 2021-03-05 MED ORDER — METFORMIN HCL 500 MG PO TABS
1000.0000 mg | ORAL_TABLET | Freq: Two times a day (BID) | ORAL | 1 refills | Status: DC
  Filled 2021-03-05: qty 120, 30d supply, fill #0

## 2021-03-07 ENCOUNTER — Ambulatory Visit: Payer: No Typology Code available for payment source | Admitting: Internal Medicine

## 2021-03-13 ENCOUNTER — Other Ambulatory Visit (HOSPITAL_COMMUNITY): Payer: Self-pay

## 2021-03-15 ENCOUNTER — Other Ambulatory Visit (HOSPITAL_COMMUNITY): Payer: Self-pay

## 2021-04-10 LAB — HM DIABETES EYE EXAM

## 2021-05-01 ENCOUNTER — Ambulatory Visit: Payer: Self-pay | Admitting: Internal Medicine

## 2021-05-01 ENCOUNTER — Other Ambulatory Visit (HOSPITAL_COMMUNITY): Payer: Self-pay

## 2021-05-01 VITALS — BP 151/80 | HR 102 | Temp 98.4°F | Wt 227.8 lb

## 2021-05-01 DIAGNOSIS — I10 Essential (primary) hypertension: Secondary | ICD-10-CM

## 2021-05-01 DIAGNOSIS — Z0000 Encounter for general adult medical examination without abnormal findings: Secondary | ICD-10-CM

## 2021-05-01 DIAGNOSIS — E119 Type 2 diabetes mellitus without complications: Secondary | ICD-10-CM

## 2021-05-01 DIAGNOSIS — G8929 Other chronic pain: Secondary | ICD-10-CM

## 2021-05-01 DIAGNOSIS — E039 Hypothyroidism, unspecified: Secondary | ICD-10-CM

## 2021-05-01 DIAGNOSIS — E785 Hyperlipidemia, unspecified: Secondary | ICD-10-CM

## 2021-05-01 DIAGNOSIS — M549 Dorsalgia, unspecified: Secondary | ICD-10-CM | POA: Insufficient documentation

## 2021-05-01 DIAGNOSIS — M545 Low back pain, unspecified: Secondary | ICD-10-CM

## 2021-05-01 LAB — POCT GLYCOSYLATED HEMOGLOBIN (HGB A1C): Hemoglobin A1C: 8.4 % — AB (ref 4.0–5.6)

## 2021-05-01 LAB — GLUCOSE, CAPILLARY: Glucose-Capillary: 174 mg/dL — ABNORMAL HIGH (ref 70–99)

## 2021-05-01 MED ORDER — EMPAGLIFLOZIN 25 MG PO TABS
25.0000 mg | ORAL_TABLET | Freq: Every day | ORAL | 3 refills | Status: DC
  Filled 2021-05-01: qty 30, 30d supply, fill #0
  Filled 2021-06-24: qty 30, 30d supply, fill #1
  Filled 2021-07-30 – 2021-08-08 (×2): qty 30, 30d supply, fill #2
  Filled 2021-09-06: qty 30, 30d supply, fill #3
  Filled 2021-10-24: qty 30, 30d supply, fill #4
  Filled 2021-12-02: qty 30, 30d supply, fill #5

## 2021-05-01 MED ORDER — LEVOTHYROXINE SODIUM 25 MCG PO TABS
25.0000 ug | ORAL_TABLET | Freq: Every day | ORAL | 2 refills | Status: DC
  Filled 2021-05-01: qty 30, 30d supply, fill #0
  Filled 2021-06-24: qty 30, 30d supply, fill #1
  Filled 2021-07-30 – 2021-08-08 (×2): qty 30, 30d supply, fill #2

## 2021-05-01 MED ORDER — METFORMIN HCL 500 MG PO TABS
1000.0000 mg | ORAL_TABLET | Freq: Two times a day (BID) | ORAL | 3 refills | Status: DC
  Filled 2021-05-01: qty 120, 30d supply, fill #0
  Filled 2021-06-24: qty 120, 30d supply, fill #1

## 2021-05-01 MED ORDER — LISINOPRIL-HYDROCHLOROTHIAZIDE 20-12.5 MG PO TABS
2.0000 | ORAL_TABLET | Freq: Every day | ORAL | 2 refills | Status: DC
  Filled 2021-05-01: qty 60, 30d supply, fill #0
  Filled 2021-06-24: qty 60, 30d supply, fill #1
  Filled 2021-07-30 – 2021-08-08 (×2): qty 60, 30d supply, fill #2

## 2021-05-01 MED ORDER — ATORVASTATIN CALCIUM 20 MG PO TABS
20.0000 mg | ORAL_TABLET | Freq: Every day | ORAL | 3 refills | Status: DC
  Filled 2021-05-01: qty 30, 30d supply, fill #0
  Filled 2021-06-24: qty 30, 30d supply, fill #1
  Filled 2021-07-30 – 2021-08-08 (×2): qty 30, 30d supply, fill #2
  Filled 2021-09-06: qty 30, 30d supply, fill #3
  Filled 2021-10-24: qty 30, 30d supply, fill #4

## 2021-05-01 NOTE — Progress Notes (Signed)
° °  CC: diabetes follow up  HPI:  Ms.Velia Jude Hailstone is a 61 y.o. PMH noted below, who presents to the [facility] with complaints of diabetes follow up. To see the management of his acute and chronic conditions, please refer to the A&P note under the encounters tab.   Past Medical History:  Diagnosis Date   Diabetes mellitus without complication ([place])    Hypertension    Thyroid disease    Review of Systems:  positive for gas, negative for abdominal pain, diarrhea, or dizziness.  Physical Exam: Gen: overweight middle aged woman in NAD HEENT: normocephalic atraumatic, MMM CV: RRR, no m/r/g   Resp: CTAB, normal WOB  GI: soft, nontender MSK: moves all extremities without difficulty; no tenderness of the lumbar back to palpation. No muscle spasms noted Skin:warm and dry Neuro:alert answering questions appropriately Psych: normal affect   Assessment & Plan:   See Encounters Tab for problem based charting.  Patient discussed with Dr. Evette Doffing

## 2021-05-01 NOTE — Assessment & Plan Note (Signed)
Patient says she got the flu shot already this year.

## 2021-05-01 NOTE — Assessment & Plan Note (Signed)
Patient's blood pressure elevated today, still elevated on recheck. BP 151/80.  Plan: - increase lisinopril HCTZ to 40-25 daily

## 2021-05-01 NOTE — Patient Instructions (Signed)
Christina [place]  It was a pleasure seeing you in the clinic today.   We talked about your blood sugars and your blood pressures today.   Blood pressure- take two of your lisinopril-HCTZ pills every day.   We want to see back in 3 months.   Please call our clinic at [phone] if you have any questions or concerns. The best time to call is Monday-Friday from 9am-4pm, but there is someone available 24/7 at the same number. If you need medication refills, please notify your pharmacy one week in advance and they will send [place] a request.   Thank you for letting us take part in your care. We look forward to seeing you next time!   Fue un Surveyor, quantity.  Hablamos sobre sus niveles de azcar en la sangre y su presin arterial hoy.  1. Presin arterial: tome dos de sus pastillas de [employer].   Queremos ver de vuelta en 3 meses.  Llame a nuestra clnica al [phone] si tiene alguna pregunta o inquietud. El mejor horario para llamar es de lunes a viernes de 9 a. m. a 4 p. m., pero hay alguien disponible las 24 horas del da, los 7 das de la semana en el mismo nmero. Si necesita reposicin de medicamentos, por favor notifique a su farmacia con una semana de anticipacin y ellos nos enviarn una solicitud.  Gracias por dejarnos participar en su cuidado. Esperamos verte la prxima vez!

## 2021-05-01 NOTE — Assessment & Plan Note (Signed)
-  continue levothyroxine 25

## 2021-05-01 NOTE — Assessment & Plan Note (Addendum)
Patient's A1c has improved from 11 to 8.4. She is taking jardiance 25 and metformin 1000 BID. She is tolerating these medications well. Denies any side effects. She has been adherent on these medications for most of the last three months but suspect she has not been able to take them every single day based on the fill history.  - continue current dosing - consider increasing medications at next visit if A1c still >8 - will reach out to the diabetes coordinator to see if she would be charged for a visit or if there is any coverage through the wise woman program

## 2021-05-01 NOTE — Assessment & Plan Note (Signed)
Patient describes chronic low back pack that improves with tylenol but will come back every couple of days. No red flag symptoms and no muscle spasms or tenderness on exam. - recommend conservative treatment with diet and exercise - continue tylenol

## 2021-05-01 NOTE — Assessment & Plan Note (Signed)
Continue atorvastatin

## 2021-05-02 ENCOUNTER — Telehealth: Payer: Self-pay | Admitting: *Deleted

## 2021-05-02 NOTE — Telephone Encounter (Signed)
° °  Telephone encounter was:  Successful.  05/02/2021 Name: Omelia Marquart MRN: [id number] DOB: Jan 01, 1961  Forestine Na Leoni is a 61 y.o. year old female who is a primary care patient of Andrey Campanile, MD . The community resource team was consulted for assistance with [phone] Interpreter called patient and patient was asked her insurance situation she presently has the orange card ., She does not think she can qualify for medicaid at this time as she is completing her immigration status will provide medicaid aaplication  Care guide performed the following interventions: Patient provided with information about care guide support team and interviewed to confirm resource needs Follow up call placed to community resources to determine status of patients referral.  Follow Up Plan:  No further follow up planned at this time. The patient has been provided with needed resources. Will followup   Alois Cliche -[facility] Guide , Embedded Care Coordination [facility], Care Management  [phone] [address] [place] , [place] [address] Email : [email or web address]

## 2021-05-02 NOTE — Progress Notes (Signed)
Internal Medicine Clinic Attending ° °Case discussed with Dr. DeMaio  At the time of the visit.  We reviewed the resident’s history and exam and pertinent patient test results.  I agree with the assessment, diagnosis, and plan of care documented in the resident’s note. ° ° °

## 2021-05-07 ENCOUNTER — Encounter: Payer: Self-pay | Admitting: Dietician

## 2021-05-13 ENCOUNTER — Telehealth: Payer: Self-pay | Admitting: *Deleted

## 2021-05-13 NOTE — Telephone Encounter (Signed)
? ?  Telephone encounter was:  Successful.  ?05/13/2021 ?Name: Christina [place] MRN: [id number] DOB: Apr 02, 1960 ? ?Christina [place] is a 61 y.o. year old female who is a primary care patient of Andrey Campanile, MD . The community resource team was consulted for assistance with [phone] interpreter Mikle Bosworth called patient to see if she has received it , she says she has not checked the mail but she took down the number if she neeeded to reach out ? ?Care guide performed the following interventions: Follow up call placed to community resources to determine status of patients referral. ? ?Follow Up Plan:  No further follow up planned at this time. The patient has been provided with needed resources. ? ?Alois Cliche -Berneda Rose ?Care Guide , Embedded Care Coordination ?[facility], Care Management  ?[phone] ?[address] [place] , [place] [address] ?Email : [email or web address] ?  ? ?

## 2021-06-24 ENCOUNTER — Other Ambulatory Visit (HOSPITAL_COMMUNITY): Payer: Self-pay

## 2021-06-25 ENCOUNTER — Other Ambulatory Visit (HOSPITAL_COMMUNITY): Payer: Self-pay

## 2021-07-17 ENCOUNTER — Ambulatory Visit: Payer: Self-pay | Admitting: Internal Medicine

## 2021-07-17 VITALS — BP 140/78 | HR 86 | Temp 98.3°F | Ht 65.0 in | Wt 225.1 lb

## 2021-07-17 DIAGNOSIS — E119 Type 2 diabetes mellitus without complications: Secondary | ICD-10-CM

## 2021-07-17 DIAGNOSIS — I10 Essential (primary) hypertension: Secondary | ICD-10-CM

## 2021-07-17 DIAGNOSIS — R21 Rash and other nonspecific skin eruption: Secondary | ICD-10-CM

## 2021-07-17 LAB — POCT GLYCOSYLATED HEMOGLOBIN (HGB A1C): Hemoglobin A1C: 8.1 % — AB (ref 4.0–5.6)

## 2021-07-17 LAB — GLUCOSE, CAPILLARY: Glucose-Capillary: 165 mg/dL — ABNORMAL HIGH (ref 70–99)

## 2021-07-17 MED ORDER — CLOTRIMAZOLE 1 % EX CREA: 1.0000 "application " | TOPICAL_CREAM | Freq: Two times a day (BID) | CUTANEOUS | 0 refills | Status: DC

## 2021-07-17 MED ORDER — AMLODIPINE BESYLATE 5 MG PO TABS: 5.0000 mg | ORAL_TABLET | Freq: Every day | ORAL | 11 refills | Status: DC

## 2021-07-17 MED ORDER — SITAGLIPTIN PHOS-METFORMIN HCL 50-1000 MG PO TABS
1.0000 | ORAL_TABLET | Freq: Two times a day (BID) | ORAL | 3 refills | Status: DC
  Filled 2021-08-09: qty 60, 30d supply, fill #0
  Filled 2021-09-06: qty 60, 30d supply, fill #1
  Filled 2021-10-09 – 2021-10-24 (×2): qty 60, 30d supply, fill #2
  Filled 2021-12-02: qty 60, 30d supply, fill #3

## 2021-07-17 MED ORDER — SITAGLIPTIN PHOS-METFORMIN HCL 50-1000 MG PO TABS: 1.0000 | ORAL_TABLET | Freq: Two times a day (BID) | ORAL | 3 refills | Status: DC

## 2021-07-17 NOTE — Progress Notes (Signed)
? ?  CC: diabetes follow up ? ?HPI: ? ?[name] is a 61 y.o. PMH noted below, who presents to the [facility] with complaints of recent viral illness. To see the management of his acute and chronic conditions, please refer to the A&P note under the encounters tab.  ? ?Past Medical History:  ?Diagnosis Date  ? Diabetes mellitus without complication ([place])   ? Hypertension   ? Thyroid disease   ? ?Review of Systems:  positive for sore throat, recent viral infection, negative for shortness of breath, chest pain, dysphagia ? ?Physical Exam: ?Gen: middle aged woman in NAD ?HEENT: normocephalic atraumatic, MMM ?CV: RRR, no m/r/g   ?Resp: CTAB, normal WOB  ?GI: soft, nontender ?MSK: moves all extremities without difficulty ?Skin:warm and dry ?Neuro:alert answering questions appropriately ?Psych: normal affect ? ? ?Assessment & Plan:  ? ?See Encounters Tab for problem based charting. ? ?Patient discussed with Dr. Saverio [place]  ? ?

## 2021-07-17 NOTE — Patient Instructions (Addendum)
Christina [place] ? ?Fue un placer verte hoy en la cl?nica. ? ?Hablamos sobre su diabetes y su presi?n arterial. ? ?Diabetes- Voy a cambiar su medicamento. Seguir? tomando jardiance, Biomedical engineer he a?adido un medicamento a su metformina. Esto seguir? siendo solo dos p?ldoras al d?a porque los medicamentos se combinan ? ?Presi?n arterial: contin?e tomando su lisinopril HCTZ. Voy a comenzar con Solicitor. ? ?Erupci?n: pruebe esta crema que le recet?. Si no mejora, podemos intentar otra cosa en la cl?nica. ? ?Llame a nuestra cl?nica al [phone] si tiene alguna pregunta o inquietud. El mejor horario para llamar es de lunes a viernes de 9 a. m. a 4 p. m., pero hay alguien disponible las 24 horas del d?a, los 7 d?as de la semana en el mismo n?mero. Si necesita reposici?n de medicamentos, por favor notifique a su farmacia con una semana de anticipaci?n y ellos nos enviar?n una solicitud. ? ?Gracias por dejarnos participar en su cuidado. ?Esperamos verte la pr?xima vez ? ? ?Ian Bushman ? ?It was a pleasure seeing you in the clinic today.  ? ?We talked about your diabetes and your blood pressure. ? ?Diabetes- I am changing your medication. You will continue to take jardiance but I have added a medication to your metformin. This will still only be two pills a day because the medications are combined ? ?Blood pressure- continue taking your lisinopril HCTZ. I am going to start you on another medication called amlodipine.  ? ?Rash- try this cream I prescribed. If it does not get better we can try something else in clinic.  ? ?Please call our clinic at [phone] if you have any questions or concerns. The best time to call is Monday-Friday from 9am-4pm, but there is someone available 24/7 at the same number. If you need medication refills, please notify your pharmacy one week in advance and they will send [place] a request. ?  ?Thank you for letting us take part in your care. We look forward to seeing you  next time! ? ?

## 2021-07-17 NOTE — Assessment & Plan Note (Signed)
BP still slightly above goal today at 144/79. Have been more lenient with pressures in the past due to medication cost issues however patient says she should be able to afford an increase of $4 a month. ?- continue zestoretic ?- start amlodipine 5 ?

## 2021-07-17 NOTE — Assessment & Plan Note (Signed)
A1c still above goal at 8.1. Discussed medication options with patient who could not afford any of the $10 medications. We are also adjusting BP meds today which limits things. However, patient is currently already taking metformin and jardiance. Luckily there is a combo pill for metformin and sitagliptin.  ?- continue jardiance ?- combine metformin and sitagliptin ?

## 2021-07-18 NOTE — Progress Notes (Signed)
Internal Medicine Clinic Attending ° °Case discussed with Dr. DeMaio  At the time of the visit.  We reviewed the resident’s history and exam and pertinent patient test results.  I agree with the assessment, diagnosis, and plan of care documented in the resident’s note. ° ° °

## 2021-07-30 ENCOUNTER — Other Ambulatory Visit (HOSPITAL_COMMUNITY): Payer: Self-pay

## 2021-07-31 ENCOUNTER — Other Ambulatory Visit (HOSPITAL_COMMUNITY): Payer: Self-pay

## 2021-08-08 ENCOUNTER — Other Ambulatory Visit (HOSPITAL_COMMUNITY): Payer: Self-pay

## 2021-08-09 ENCOUNTER — Other Ambulatory Visit (HOSPITAL_COMMUNITY): Payer: Self-pay

## 2021-08-13 ENCOUNTER — Other Ambulatory Visit (HOSPITAL_COMMUNITY): Payer: Self-pay

## 2021-08-27 ENCOUNTER — Ambulatory Visit
Admission: RE | Admit: 2021-08-27 | Discharge: 2021-08-27 | Disposition: A | Discharge status: Home / Self Care | Payer: Self-pay | Source: Ambulatory Visit | Attending: Obstetrics and Gynecology | Admitting: Obstetrics and Gynecology

## 2021-08-27 ENCOUNTER — Ambulatory Visit
Admission: RE | Admit: 2021-08-27 | Discharge: 2021-08-27 | Disposition: A | Discharge status: Home / Self Care | Payer: No Typology Code available for payment source | Source: Ambulatory Visit | Attending: Obstetrics and Gynecology | Admitting: Obstetrics and Gynecology

## 2021-08-27 ENCOUNTER — Ambulatory Visit: Payer: Self-pay | Admitting: *Deleted

## 2021-08-27 VITALS — BP 140/86 | Wt 223.0 lb

## 2021-08-27 DIAGNOSIS — N6489 Other specified disorders of breast: Secondary | ICD-10-CM

## 2021-08-27 DIAGNOSIS — Z1211 Encounter for screening for malignant neoplasm of colon: Secondary | ICD-10-CM

## 2021-08-27 DIAGNOSIS — Z1239 Encounter for other screening for malignant neoplasm of breast: Secondary | ICD-10-CM

## 2021-08-27 NOTE — Progress Notes (Signed)
Christina Norton is a 61 y.o. female who presents to [facility] today with no complaints. Patient referred to BCCCP due to patient had a right diagnostic mammogram and ultrasound completed 02/25/2021 that six month follow up is recommended.   Pap Smear: Pap smear not completed today. Last Pap smear was 08/02/2020 at [facility] and was normal with negative HPV. Per patient has history of an abnormal Pap smear in 2013 that was abnormal with HPV positive. Patient stated she had a colposcopy and cryotherapy completed for follow-up in [place]. Patient stated that she has had at least three normal Pap smears since cryotherapy. Last Pap smear result is available in Epic.   Physical exam: Breasts Breasts symmetrical. No skin abnormalities bilateral breasts. No nipple retraction bilateral breasts. No nipple discharge bilateral breasts. No lymphadenopathy. No lumps palpated bilateral breasts. No complaints of pain or tenderness on exam.      MS DIGITAL DIAG TOMO UNI RIGHT  Result Date: 02/25/2021 CLINICAL DATA:  Short-term follow-up for a probably benign right breast asymmetry, initially assessed on 08/23/2020 as a recall from the baseline screening exam. EXAM: DIGITAL DIAGNOSTIC UNILATERAL RIGHT MAMMOGRAM WITH TOMOSYNTHESIS AND CAD; ULTRASOUND RIGHT BREAST LIMITED TECHNIQUE: Right digital diagnostic mammography and breast tomosynthesis was performed. The images were evaluated with computer-aided detection.; Targeted ultrasound examination of the right breast was performed COMPARISON:  Previous exam(s). ACR Breast Density Category b: There are scattered areas of fibroglandular density. FINDINGS: The area of mammographic asymmetry, inner slightly upper aspect of the right breast, is unchanged. There are no defined masses, no other areas of asymmetry, no architectural distortion and no suspicious calcifications. No mammographic change. Targeted right breast ultrasound is performed, showing a  heterogeneous lesion at 1 o'clock, 4 cm the nipple, measuring 2.9 x 0.9 x 1.7 cm, without significant change from the prior study and consistent focal fibrocystic change or an apocrine cyst. IMPRESSION: 1. Probably benign area of asymmetry in the right breast corresponding to a 2.9 cm sonographic lesion that is likely a focal fibrocystic change or an apocrine cyst. There has been no significant change in 6 months. Additional short-term follow-up recommended. RECOMMENDATION: 1. Diagnostic mammography and right breast ultrasound in 6 months. I have discussed the findings and recommendations with the patient. If applicable, a reminder letter will be sent to the patient regarding the next appointment. BI-RADS CATEGORY  3: Probably benign. Electronically Signed   By: Amie Portland M.D.   On: 02/25/2021 15:26  MS DIGITAL DIAG TOMO UNI RIGHT  Result Date: 08/23/2020 CLINICAL DATA:  61 year old female presenting as a recall from baseline screening for possible right breast asymmetry. EXAM: DIGITAL DIAGNOSTIC UNILATERAL RIGHT MAMMOGRAM WITH TOMOSYNTHESIS AND CAD; ULTRASOUND RIGHT BREAST LIMITED TECHNIQUE: Right digital diagnostic mammography and breast tomosynthesis was performed. The images were evaluated with computer-aided detection.; Targeted ultrasound examination of the right breast was performed COMPARISON:  Previous exam(s). ACR Breast Density Category b: There are scattered areas of fibroglandular density. FINDINGS: Mammogram: Spot compression tomosynthesis views of the right breast performed demonstrating persistence of an asymmetry in the upper inner right breast best seen on the spot cc view measuring approximately 4.1 cm. Ultrasound: Targeted ultrasound performed throughout the upper inner quadrant of the right breast demonstrating an area of probable a poker in metaplasia at 1 o'clock 4 cm from the nipple measuring 2.7 x 1.1 x 1.7 cm, which likely accounts for a majority of the asymmetry seen  mammographically. There is no suspicious solid mass. IMPRESSION: Probably benign findings  in the upper inner right breast. RECOMMENDATION: Diagnostic right breast mammogram and ultrasound in 6 months. I have discussed the findings and recommendations with the patient who agrees to short-term follow-up. If applicable, a reminder letter will be sent to the patient regarding the next appointment. BI-RADS CATEGORY  3: Probably benign. Electronically Signed   By: Emmaline Kluver M.D.   On: 08/23/2020 15:01  MS DIGITAL SCREENING TOMO BILATERAL  Result Date: 08/06/2020 CLINICAL DATA:  Screening. EXAM: DIGITAL SCREENING BILATERAL MAMMOGRAM WITH TOMOSYNTHESIS AND CAD TECHNIQUE: Bilateral screening digital craniocaudal and mediolateral oblique mammograms were obtained. Bilateral screening digital breast tomosynthesis was performed. The images were evaluated with computer-aided detection. COMPARISON:  None. ACR Breast Density Category b: There are scattered areas of fibroglandular density. FINDINGS: In the right breast, a possible asymmetry warrants further evaluation. In the left breast, no findings suspicious for malignancy. IMPRESSION: Further evaluation is suggested for possible asymmetry in the right breast. RECOMMENDATION: Diagnostic mammogram and possibly ultrasound of the right breast. (Code:[id number]) The patient will be contacted regarding the findings, and additional imaging will be scheduled. BI-RADS CATEGORY  0: Incomplete. Need additional imaging evaluation and/or prior mammograms for comparison. Electronically Signed   By: Ted Mcalpine M.D.   On: 08/06/2020 18:45     Pelvic/Bimanual Pap is not indicated today per BCCCP guidelines.   Smoking History: Patient has never smoked.   Patient Navigation: Patient education provided. Access to services provided for patient through [facility] program. Spanish interpreter Natale Lay from [facility] provided.   Colorectal Cancer Screening: Per patient has  never had colonoscopy completed No complaints today.    Breast and Cervical Cancer Risk Assessment: Patient does not have family history of breast cancer, known genetic mutations, or radiation treatment to the chest before age 66. Per patient has history of cervical dysplasia. Patient has no history of being immunocompromised or DES exposure in-utero.  Risk Assessment     Risk Scores       08/27/2021 08/02/2020   Last edited by: Meryl Dare, CMA Rodina Pinales, Carlye Grippe, RN   5-year risk: 1.1 % 1 %   Lifetime risk: 5.9 % 6 %            A: BCCCP exam without pap smear No complaints.  P: Referred patient to the [facility] for a diagnostic mammogram per recommendation. Appointment scheduled Tuesday, August 27, 2021 at 1420.  Priscille Heidelberg, RN 08/27/2021 1:09 PM

## 2021-08-27 NOTE — Patient Instructions (Signed)
Explained breast self awareness with Ranelle Oyster. Patient did not need a Pap smear today due to last Pap smear and HPV typing was 08/02/2020. Let her know BCCCP will cover Pap smears and HPV typing every 5 years unless has a history of abnormal Pap smears. Referred patient to the [facility] for a diagnostic mammogram per recommendation. Appointment scheduled Tuesday, August 27, 2021 at 1420. Patient aware of appointment and will be there. Ranelle Oyster verbalized understanding.  Sina Sumpter, Kathaleen Maser, RN 1:09 PM

## 2021-08-31 ENCOUNTER — Encounter: Payer: Self-pay | Admitting: *Deleted

## 2021-09-06 ENCOUNTER — Other Ambulatory Visit: Payer: Self-pay | Admitting: Internal Medicine

## 2021-09-06 ENCOUNTER — Other Ambulatory Visit (HOSPITAL_COMMUNITY): Payer: Self-pay

## 2021-09-06 DIAGNOSIS — I10 Essential (primary) hypertension: Secondary | ICD-10-CM

## 2021-09-06 DIAGNOSIS — E039 Hypothyroidism, unspecified: Secondary | ICD-10-CM

## 2021-09-08 LAB — FECAL OCCULT BLOOD, IMMUNOCHEMICAL: Fecal Occult Bld: NEGATIVE

## 2021-09-08 LAB — SPECIMEN STATUS REPORT

## 2021-09-09 ENCOUNTER — Other Ambulatory Visit (HOSPITAL_COMMUNITY): Payer: Self-pay

## 2021-09-09 ENCOUNTER — Telehealth: Payer: Self-pay

## 2021-09-09 MED ORDER — LISINOPRIL-HYDROCHLOROTHIAZIDE 20-12.5 MG PO TABS
2.0000 | ORAL_TABLET | Freq: Every day | ORAL | 2 refills | Status: DC
  Filled 2021-09-09: qty 60, 30d supply, fill #0
  Filled 2021-10-09 – 2021-10-24 (×2): qty 60, 30d supply, fill #1
  Filled 2021-12-02: qty 60, 30d supply, fill #2

## 2021-09-09 MED ORDER — LEVOTHYROXINE SODIUM 25 MCG PO TABS
25.0000 ug | ORAL_TABLET | Freq: Every day | ORAL | 2 refills | Status: DC
  Filled 2021-09-09: qty 30, 30d supply, fill #0
  Filled 2021-10-09 – 2021-10-24 (×2): qty 30, 30d supply, fill #1
  Filled 2021-12-02: qty 30, 30d supply, fill #2

## 2021-09-09 NOTE — Telephone Encounter (Signed)
Called patient via [employer] [phone] to give FIT test results. Informed patient that FIT test was Normal. Patient voiced understanding.

## 2021-10-09 ENCOUNTER — Other Ambulatory Visit (HOSPITAL_COMMUNITY): Payer: Self-pay

## 2021-10-17 ENCOUNTER — Other Ambulatory Visit (HOSPITAL_COMMUNITY): Payer: Self-pay

## 2021-10-24 ENCOUNTER — Other Ambulatory Visit (HOSPITAL_COMMUNITY): Payer: Self-pay

## 2021-11-05 ENCOUNTER — Encounter: Payer: Self-pay | Admitting: Student

## 2021-11-05 ENCOUNTER — Other Ambulatory Visit: Payer: Self-pay

## 2021-11-05 ENCOUNTER — Ambulatory Visit: Payer: Self-pay | Admitting: Student

## 2021-11-05 VITALS — BP 122/67 | HR 85 | Temp 98.3°F | Resp 28 | Ht 65.0 in | Wt 221.8 lb

## 2021-11-05 DIAGNOSIS — E785 Hyperlipidemia, unspecified: Secondary | ICD-10-CM

## 2021-11-05 DIAGNOSIS — M25569 Pain in unspecified knee: Secondary | ICD-10-CM | POA: Insufficient documentation

## 2021-11-05 DIAGNOSIS — E119 Type 2 diabetes mellitus without complications: Secondary | ICD-10-CM

## 2021-11-05 DIAGNOSIS — E039 Hypothyroidism, unspecified: Secondary | ICD-10-CM

## 2021-11-05 DIAGNOSIS — M25561 Pain in right knee: Secondary | ICD-10-CM

## 2021-11-05 DIAGNOSIS — Z7984 Long term (current) use of oral hypoglycemic drugs: Secondary | ICD-10-CM

## 2021-11-05 DIAGNOSIS — I10 Essential (primary) hypertension: Secondary | ICD-10-CM

## 2021-11-05 DIAGNOSIS — M25562 Pain in left knee: Secondary | ICD-10-CM

## 2021-11-05 LAB — POCT GLYCOSYLATED HEMOGLOBIN (HGB A1C): Hemoglobin A1C: 7.1 % — AB (ref 4.0–5.6)

## 2021-11-05 LAB — GLUCOSE, CAPILLARY: Glucose-Capillary: 130 mg/dL — ABNORMAL HIGH (ref 70–99)

## 2021-11-05 NOTE — Assessment & Plan Note (Addendum)
Patient continues to take [place] and Janumet.  Today's POC hemoglobin A1c is 7.1, down from 8 three months ago and ~12 almost a year ago.  She has done an excellent job in the last year to regain control of her blood sugar.  However, I suspect that her itching, numbness, tingling sensation in her hands is due to diabetic neuropathy. Highlighted the importance of continued blood glucose control to minimize systemic symptoms of diabetes. - Continue empagliflozin 25 mg daily - Continue Sitagliptin/metformin 50/1000 mg twice daily with meals

## 2021-11-05 NOTE — Progress Notes (Signed)
Subjective:  CC: Itching, numbness, and tingling sensation in the fingers.  HPI:  [name] is a 61 y.o. female with a past medical history stated below and presents today for routine follow-up. Please see problem based assessment and plan for additional details.  This patient was interviewed with an in person Spanish interpreter.  Overall Christina [facility] is feeling well today.  She complains of some itching, numbness, and tingling sensation in her fingers that bothers her most at nighttime.  She reports that she stopped taking her amlodipine shortly after it was prescribed due to headaches.  After she stopped taking the medicine, her headaches improved.  Review of systems negative for leg swelling, cold intolerance, weight changes.  She spends her days at home taking care of her grandchildren.  Past Medical History:  Diagnosis Date   Diabetes mellitus without complication (HCC)    Hyperlipidemia    Hypertension    Thyroid disease     Current Outpatient Medications on File Prior to Visit  Medication Sig Dispense Refill   atorvastatin (LIPITOR) 20 MG tablet Take 1 tablet (20 mg total) by mouth daily. 90 tablet 3   clotrimazole (LOTRIMIN) 1 % cream Apply 1 application. topically 2 (two) times daily. (Patient not taking: Reported on 08/27/2021) 30 g 0   empagliflozin (JARDIANCE) 25 MG TABS tablet Take 1 tablet (25 mg total) by mouth daily. 90 tablet 3   levothyroxine (SYNTHROID) 25 MCG tablet Take 1 tablet (25 mcg total) by mouth daily before breakfast. 30 tablet 2   lisinopril-hydrochlorothiazide (ZESTORETIC) 20-12.5 MG tablet Take 2 tablets by mouth daily. 60 tablet 2   sitaGLIPtin-metformin (JANUMET) 50-1000 MG tablet Take 1 tablet by mouth 2 (two) times daily with a meal. 60 tablet 3   No current facility-administered medications on file prior to visit.    Family History  Problem Relation Age of Onset   Osteoporosis Mother     Social History    Socioeconomic History   Marital status: Divorced    Spouse name: Not on file   Number of children: 3   Years of education: Not on file   Highest education level: Some college, no degree  Occupational History   Not on file  Tobacco Use   Smoking status: Never   Smokeless tobacco: Never  Vaping Use   Vaping Use: Never used  Substance and Sexual Activity   Alcohol use: Not Currently    Comment: RARELY   Drug use: Never   Sexual activity: Not Currently    Birth control/protection: Post-menopausal  Other Topics Concern   Not on file  Social History Narrative   Not on file   Social Determinants of Health   Financial Resource Strain: Low Risk  (05/02/2021)   Overall Financial Resource Strain (CARDIA)    Difficulty of Paying Living Expenses: Not very hard  Food Insecurity: No Food Insecurity (08/27/2021)   Hunger Vital Sign    Worried About Running Out of Food in the Last Year: Never true    Ran Out of Food in the Last Year: Never true  Transportation Needs: Unmet Transportation Needs (08/27/2021)   PRAPARE - Administrator, Civil Service (Medical): Yes    Lack of Transportation (Non-Medical): Yes  Physical Activity: Not on file  Stress: Not on file  Social Connections: Not on file  Intimate Partner Violence: Not on file    Review of Systems: ROS negative except for what is noted on the assessment and plan.  Objective:   Vitals:   11/05/21 0909  BP: 122/67  Pulse: 85  Resp: (!) 28  Temp: 98.3 F (36.8 C)  TempSrc: Oral  SpO2: 98%  Weight: 221 lb 12.8 oz (100.6 kg)  Height: 5\' 5"  (1.651 m)    Physical Exam: Constitutional: Well-appearing female sitting in chair. HENT: Mucous membranes moist.   Neck: Supple without lymphadenopathy. Cardiovascular: Regular rate and rhythm. Pulmonary/Chest: Clear to auscultation.  Normal work of breathing. Abdominal: Soft, nontender, nondistended. MSK: Normal bulk and tone.  No lower extremity edema.  Bilateral  knees ROM intact with crepitus.  Minimal effusion.  Minimal tenderness. Neurological: Distal upper extremity sensation intact.  Bilateral wrist flexion 5 out of 5 strength.  Diminished DTRs bilateral triceps, patellar, Achilles. Skin: Warm and dry. Psych: Appropriate mood and affect.   Assessment & Plan:  Hypertension Patient reports discontinuing her amlodipine 5 mg shortly after was prescribed due to headaches.  Her headaches resolved after she stopped taking this medicine.  She continues to take her lisinopril HCTZ combo pill, 2 tablets with breakfast.  We discussed home blood pressure measurements.  She usually measures her blood pressure when she does not feel well.  She says that her blood pressures recently have been in the 130 systolic.  Blood pressure today is 122/67.  We will continue current regimen.  Discussed strategies for home blood pressure measurement including measuring in the morning and before bed. - Continue lisinopril/HCTZ 20/12.5 mg 2 tablets daily - Follow-up BMP  Hypothyroidism No cold intolerance.  No weight gain.  No extremity swelling.  Occasionally feels fullness in her neck.  We will repeat TSH today. - Continue levothyroxine 25 mcg - Follow-up TSH.  Type 2 diabetes mellitus without complication, without long-term current use of insulin (HCC) Patient continues to take Jardiance and Janumet.  Today's POC hemoglobin A1c is 7.1, down from 8 three months ago and ~12 almost a year ago.  She has done an excellent job in the last year to regain control of her blood sugar.  However, I suspect that her itching, numbness, tingling sensation in her hands is due to diabetic neuropathy. Highlighted the importance of continued blood glucose control to minimize systemic symptoms of diabetes. - Continue empagliflozin 25 mg daily - Continue Sitagliptin/metformin 50/1000 mg twice daily with meals  Hyperlipidemia Patient continues to take atorvastatin 20 mg daily. - Follow-up lipid  panel.  Knee pain Patient reports occasional pain in bilateral knees.  Exam is notable for crepitus on range of motion bilaterally.  Minimal effusion.  No joint line tenderness.  No swelling or erythema.  Suspect this is likely related to osteoarthritis.  Advised the patient can take Tylenol.  She should avoid ibuprofen.    Patient seen with Dr. 03-23-2003, M.D. [facility]  PGY-1 Pager: [phone] Date 11/05/2021  Time 11:43 AM

## 2021-11-05 NOTE — Patient Instructions (Signed)
Hoy hablamos de la presin arterial alta, la diabetes, el colesterol alto, la enfermedad de la tiroides, el hormigueo en las manos y Chief Technology Officer de rodilla.  Para su presin arterial alta, contine tomando lisinopril/HCTZ 2 tabletas con el desayuno.  Para la diabetes, contine tomando su Jardiance y [place] IVF como lo ha hecho hasta ahora. Sus niveles en sangre muestran que ha hecho un trabajo realmente bueno al reducir su nivel de azcar en sangre. Sigan con el Bary Leriche.  Para su colesterol alto, contine tomando atorvastatina.  Para su enfermedad de tiroides, contine tomando levotiroxina. Mediremos sus niveles de tiroides hoy y lo llamar para informarle sobre ese resultado.  Sospecho que el hormigueo en las manos puede deberse a una afeccin llamada neuropata diabtica. La mejor manera de evitar que esta afeccin empeore es tratar la diabetes. Si se vuelve tan molesto que interfiere con su vida diaria, llame al consultorio para programar una cita para una evaluacin.  Para el dolor de rodilla, puede tomar Tylenol. Evite el ibuprofeno ya que puede provocar efectos [employer].  ----  Thank you, Christina [facility] for allowing [place] to provide your care today. Today we discussed high blood pressure, diabetes, high cholesterol, thyroid disease, tingling in your hands, and knee pain.    For your high blood pressure, continue taking lisinopril/HCTZ 2 tablets with breakfast.  For diabetes continue taking your Jardiance and Janumet IVF as you have been.  Your blood levels show that you have done a really good job in reducing your blood sugar.  Keep up the good work.  For your high cholesterol, continue taking your atorvastatin.  For your thyroid disease continue taking your levothyroxine.  We will measure your thyroid levels today and I will call you about that result.  I suspect that the tingling in your hands may be due to a condition called diabetic neuropathy.  The  best way to prevent this condition from getting worse is to treat your diabetes.  If it becomes so bothersome that its interfering with your daily life, please call the office to make an appointment for an evaluation.  For your knee pain you may take Tylenol.  Avoid ibuprofen as it can cause significant side effects.  I have ordered the following labs for you:  Lab Orders         TSH         Lipid Profile         BMP8+Anion Gap         POC Hbg A1C      Referrals ordered today:   Referral Orders  No referral(s) requested today     I have ordered the following medication/changed the following medications:   Stop the following medications: There are no discontinued medications.   Start the following medications: No orders of the defined types were placed in this encounter.    Follow up: 4-6 months   We look forward to seeing you next time. Please call our clinic at [phone] if you have any questions or concerns. The best time to call is Monday-Friday from 9am-4pm, but there is someone available 24/7. If after hours or the weekend, call the main hospital number and ask for the Internal Medicine Resident On-Call. If you need medication refills, please notify your pharmacy one week in advance and they will send [place] a request.   Thank you for trusting me with your care. Wishing you the best!   Christina Mood, MD [facility]

## 2021-11-05 NOTE — Assessment & Plan Note (Addendum)
No cold intolerance.  No weight gain.  No extremity swelling.  Occasionally feels fullness in her neck.  We will repeat TSH today. - Continue levothyroxine 25 mcg - Follow-up TSH.

## 2021-11-05 NOTE — Assessment & Plan Note (Signed)
Patient reports discontinuing her amlodipine 5 mg shortly after was prescribed due to headaches.  Her headaches resolved after she stopped taking this medicine.  She continues to take her lisinopril HCTZ combo pill, 2 tablets with breakfast.  We discussed home blood pressure measurements.  She usually measures her blood pressure when she does not feel well.  She says that her blood pressures recently have been in the 130 systolic.  Blood pressure today is 122/67.  We will continue current regimen.  Discussed strategies for home blood pressure measurement including measuring in the morning and before bed. - Continue lisinopril/HCTZ 20/12.5 mg 2 tablets daily - Follow-up BMP

## 2021-11-05 NOTE — Assessment & Plan Note (Signed)
Patient reports occasional pain in bilateral knees.  Exam is notable for crepitus on range of motion bilaterally.  Minimal effusion.  No joint line tenderness.  No swelling or erythema.  Suspect this is likely related to osteoarthritis.  Advised the patient can take Tylenol.  She should avoid ibuprofen.

## 2021-11-05 NOTE — Assessment & Plan Note (Signed)
Patient continues to take atorvastatin 20 mg daily. - Follow-up lipid panel.

## 2021-11-06 LAB — LIPID PANEL
Chol/HDL Ratio: 4.9 ratio — ABNORMAL HIGH (ref 0.0–4.4)
Cholesterol, Total: 193 mg/dL (ref 100–199)
HDL: 39 mg/dL — ABNORMAL LOW (ref >39)
LDL Chol Calc (NIH): 125 mg/dL — ABNORMAL HIGH (ref 0–99)
Triglycerides: 163 mg/dL — ABNORMAL HIGH (ref 0–149)
VLDL Cholesterol Cal: 29 mg/dL (ref 5–40)

## 2021-11-06 LAB — BMP8+ANION GAP
Anion Gap: 15 mmol/L (ref 10.0–18.0)
BUN/Creatinine Ratio: 24 — ABNORMAL HIGH (ref 9–23)
BUN: 13 mg/dL (ref 6–24)
CO2: 23 mmol/L (ref 20–29)
Calcium: 9.4 mg/dL (ref 8.7–10.2)
Chloride: 99 mmol/L (ref 96–106)
Creatinine, Ser: 0.55 mg/dL — ABNORMAL LOW (ref 0.57–1.00)
Glucose: 127 mg/dL — ABNORMAL HIGH (ref 70–99)
Potassium: 4.2 mmol/L (ref 3.5–5.2)
Sodium: 137 mmol/L (ref 134–144)
eGFR: 106 mL/min/{1.73_m2} (ref >59)

## 2021-11-06 LAB — TSH: TSH: 3.16 u[IU]/mL (ref 0.450–4.500)

## 2021-11-06 NOTE — Progress Notes (Signed)
Internal Medicine Clinic Attending  I saw and evaluated the patient.  I personally confirmed the key portions of the history and exam documented by Dr. McLendon and I reviewed pertinent patient test results.  The assessment, diagnosis, and plan were formulated together and I agree with the documentation in the resident's note.  

## 2021-12-02 ENCOUNTER — Other Ambulatory Visit (HOSPITAL_COMMUNITY): Payer: Self-pay

## 2021-12-05 ENCOUNTER — Telehealth: Payer: Self-pay

## 2021-12-05 NOTE — Telephone Encounter (Signed)
Patient called she is requesting a referral to the dentist.

## 2021-12-09 ENCOUNTER — Inpatient Hospital Stay: Payer: Self-pay

## 2021-12-09 ENCOUNTER — Other Ambulatory Visit: Payer: Self-pay

## 2021-12-09 NOTE — Progress Notes (Signed)
Patient presented to [facility] clinic for screening. Patient was recently seen at Internal Medicine on 11/05/21. During her visit patient had HbA1C, glucose and lipid panel checked. Patient cannot be screened in [place] at this time. Did discuss her lab results with her and answered any questions that she had. Encouraged patient to continue checking BP at home and taking medications daily. Encouraged patient about the positive changes in blood work! Patient will FU with Internal Medicine in 4-6 mo.

## 2022-01-08 ENCOUNTER — Other Ambulatory Visit (HOSPITAL_COMMUNITY): Payer: Self-pay

## 2022-01-08 ENCOUNTER — Other Ambulatory Visit: Payer: Self-pay

## 2022-01-08 ENCOUNTER — Other Ambulatory Visit: Payer: Self-pay | Admitting: Internal Medicine

## 2022-01-08 ENCOUNTER — Other Ambulatory Visit (HOSPITAL_BASED_OUTPATIENT_CLINIC_OR_DEPARTMENT_OTHER): Payer: Self-pay

## 2022-01-08 DIAGNOSIS — I10 Essential (primary) hypertension: Secondary | ICD-10-CM

## 2022-01-08 DIAGNOSIS — E039 Hypothyroidism, unspecified: Secondary | ICD-10-CM

## 2022-01-08 MED ORDER — LISINOPRIL-HYDROCHLOROTHIAZIDE 20-12.5 MG PO TABS
2.0000 | ORAL_TABLET | Freq: Every day | ORAL | 2 refills | Status: DC
  Filled 2022-01-08: qty 60, 30d supply, fill #0
  Filled 2022-02-13: qty 60, 30d supply, fill #1
  Filled 2022-03-18: qty 60, 30d supply, fill #2

## 2022-01-08 MED ORDER — LEVOTHYROXINE SODIUM 25 MCG PO TABS
25.0000 ug | ORAL_TABLET | Freq: Every day | ORAL | 2 refills | Status: DC
  Filled 2022-01-08: qty 30, 30d supply, fill #0

## 2022-01-08 MED ORDER — JANUMET 50-1000 MG PO TABS
1.0000 | ORAL_TABLET | Freq: Two times a day (BID) | ORAL | 3 refills | Status: DC
  Filled 2022-01-08: qty 60, 30d supply, fill #0
  Filled 2022-02-13: qty 60, 30d supply, fill #1
  Filled 2022-03-18: qty 60, 30d supply, fill #2
  Filled 2022-04-22: qty 60, 30d supply, fill #3

## 2022-01-08 NOTE — Telephone Encounter (Signed)
Reviewed last office visit and relevant labs. 

## 2022-01-08 NOTE — Telephone Encounter (Signed)
Reviewed last office visit note and relevant labs.

## 2022-01-20 NOTE — Progress Notes (Deleted)
HTN Lisinopril HCTZ 20-12.5 BID  HLD LDL 125 8/30, ASCVD 8.6% could benefit from statin with LDL goal for primary prevention <100 Atorvastatin 20   T2DM Recent A1c 8/29 7.1% Jardiance 25 Sitagliptan-metformin 50-1000 BID  Hypothyroid Most recent TSH ** Synthroid  HCM Colonoscopy Flu Foot exam microalbumin

## 2022-02-13 ENCOUNTER — Encounter: Payer: Self-pay | Admitting: Student

## 2022-02-13 ENCOUNTER — Ambulatory Visit: Payer: Self-pay | Admitting: Student

## 2022-02-13 ENCOUNTER — Other Ambulatory Visit: Payer: Self-pay

## 2022-02-13 ENCOUNTER — Other Ambulatory Visit (HOSPITAL_COMMUNITY): Payer: Self-pay

## 2022-02-13 VITALS — BP 128/73 | HR 86 | Temp 98.4°F | Resp 28 | Ht 65.0 in | Wt 226.8 lb

## 2022-02-13 DIAGNOSIS — E119 Type 2 diabetes mellitus without complications: Secondary | ICD-10-CM

## 2022-02-13 DIAGNOSIS — E039 Hypothyroidism, unspecified: Secondary | ICD-10-CM

## 2022-02-13 DIAGNOSIS — Z7984 Long term (current) use of oral hypoglycemic drugs: Secondary | ICD-10-CM

## 2022-02-13 DIAGNOSIS — Z0000 Encounter for general adult medical examination without abnormal findings: Secondary | ICD-10-CM

## 2022-02-13 DIAGNOSIS — E785 Hyperlipidemia, unspecified: Secondary | ICD-10-CM

## 2022-02-13 DIAGNOSIS — Z0120 Encounter for dental examination and cleaning without abnormal findings: Secondary | ICD-10-CM

## 2022-02-13 DIAGNOSIS — I10 Essential (primary) hypertension: Secondary | ICD-10-CM

## 2022-02-13 MED ORDER — ATORVASTATIN CALCIUM 20 MG PO TABS
20.0000 mg | ORAL_TABLET | Freq: Every day | ORAL | 3 refills | Status: DC
  Filled 2022-02-13: qty 30, 30d supply, fill #0
  Filled 2022-03-18: qty 30, 30d supply, fill #1
  Filled 2022-10-05: qty 30, 30d supply, fill #2
  Filled 2022-11-05: qty 30, 30d supply, fill #3
  Filled 2022-12-07: qty 30, 30d supply, fill #4
  Filled 2023-01-06: qty 30, 30d supply, fill #5

## 2022-02-13 MED ORDER — EMPAGLIFLOZIN 25 MG PO TABS
25.0000 mg | ORAL_TABLET | Freq: Every day | ORAL | 3 refills | Status: DC
  Filled 2022-02-13: qty 30, 30d supply, fill #0
  Filled 2022-03-18: qty 30, 30d supply, fill #1
  Filled 2022-04-22: qty 30, 30d supply, fill #2
  Filled 2022-05-15: qty 30, 30d supply, fill #3
  Filled 2022-07-02: qty 30, 30d supply, fill #4
  Filled 2022-08-11: qty 30, 30d supply, fill #5
  Filled 2022-09-08: qty 30, 30d supply, fill #6
  Filled 2022-10-05: qty 30, 30d supply, fill #7
  Filled 2022-11-05: qty 30, 30d supply, fill #8
  Filled 2022-12-07: qty 30, 30d supply, fill #9
  Filled 2023-01-06 – 2023-01-19 (×5): qty 30, 30d supply, fill #10

## 2022-02-13 MED ORDER — LEVOTHYROXINE SODIUM 25 MCG PO TABS
25.0000 ug | ORAL_TABLET | Freq: Every day | ORAL | 2 refills | Status: DC
  Filled 2022-02-13: qty 30, 30d supply, fill #0

## 2022-02-13 MED ORDER — LEVOTHYROXINE SODIUM 25 MCG PO TABS
25.0000 ug | ORAL_TABLET | Freq: Every day | ORAL | 2 refills | Status: DC
  Filled 2022-02-13: qty 30, 30d supply, fill #0
  Filled 2022-03-18: qty 30, 30d supply, fill #1
  Filled 2022-04-22: qty 30, 30d supply, fill #2

## 2022-02-13 NOTE — Assessment & Plan Note (Signed)
Last A1c 7.1.  Last BMP with creatinine of 0.55, GFR.  No urine microalbumin creatinine ratio.  Current therapy includes Jardiance 25 mg daily and Sitagliptin-metformin 50-1000 twice daily. No hypoglycemic episodes. Patient is currently working on dietary habits, though is been hard to exercise because the cold weather.   Foot exam today significant for hallus valgus (bunion) deformities bilaterally on R>L. Normal microfilament and vibratory exam otherwise.  Plan -A1c today -urine microabumin/Cr ratio -Continue on current regimen otherwise

## 2022-02-13 NOTE — Patient Instructions (Addendum)
Ms.Ansleigh Phillip Heal , gracias por permitirnos ayudarle hoy. Durante esta visita hemos hablado acerca de los siguientes asuntos medicos: diabetes, hypothyroidism, hiperlipidemia, hipertension.  - por favor recoja sus medicamentos He ordenado los siguientes exmenes de laboratorio:   Lab Orders         Hemoglobin A1c         Microalbumin / Creatinine Urine Ratio      Medicamentos:   NO continue tomando los siguientes medicamentos: Medications Discontinued During This Encounter  Medication Reason   atorvastatin (LIPITOR) 20 MG tablet Reorder   empagliflozin (JARDIANCE) 25 MG TABS tablet Reorder   levothyroxine (SYNTHROID) 25 MCG tablet Reorder     EMPIECE a tomar los siguientes medicamentos: Meds ordered this encounter  Medications   levothyroxine (SYNTHROID) 25 MCG tablet    Sig: Take 1 tablet (25 mcg total) by mouth daily before breakfast.    Dispense:  30 tablet    Refill:  2    IM program   empagliflozin (JARDIANCE) 25 MG TABS tablet    Sig: Take 1 tablet (25 mg total) by mouth daily.    Dispense:  90 tablet    Refill:  3    IM program   atorvastatin (LIPITOR) 20 MG tablet    Sig: Take 1 tablet (20 mg total) by mouth daily.    Dispense:  90 tablet    Refill:  3    IM program     Cita de seguimiento: 3 months    Para contactarnos: Esperamos verte la prxima vez. Llame a nuestra clnica al telefono: [phone] si tiene alguna pregunta o inquietud. El mejor horario para llamar es de lunes a viernes de 9 a. m. a 4 p. m. Si es fuera del horario de atencin o durante el fin de [place], llame al nmero principal del hospital y pregunte por el residente de guardia de medicina interna. Si necesita reposicin de medicamentos, por favor notifique a su farmacia con una semana de anticipacin y ellos nos enviarn una solicitud.  Gracias por confiar en nosotros para cuidar de usted!   Morene Crocker, MD [facility]

## 2022-02-13 NOTE — Progress Notes (Signed)
Subjective:  CC: diabetes follow up  HPI:  Christina Norton is a 61 y.o. female with a past medical history stated below and presents today for diabetes follow up. Please see problem based assessment and plan for additional details.  Past Medical History:  Diagnosis Date   Diabetes mellitus without complication (HCC)    Hyperlipidemia    Hypertension    Thyroid disease     Current Outpatient Medications on File Prior to Visit  Medication Sig Dispense Refill   clotrimazole (LOTRIMIN) 1 % cream Apply 1 application. topically 2 (two) times daily. (Patient not taking: Reported on 08/27/2021) 30 g 0   lisinopril-hydrochlorothiazide (ZESTORETIC) 20-12.5 MG tablet Take 2 tablets by mouth daily. 60 tablet 2   sitaGLIPtin-metformin (JANUMET) 50-1000 MG tablet Take 1 tablet by mouth 2 (two) times daily with a meal. 60 tablet 3   No current facility-administered medications on file prior to visit.    Family History  Problem Relation Age of Onset   Osteoporosis Mother     Social History   Socioeconomic History   Marital status: Divorced    Spouse name: Not on file   Number of children: 3   Years of education: Not on file   Highest education level: Some college, no degree  Occupational History   Not on file  Tobacco Use   Smoking status: Never   Smokeless tobacco: Never  Vaping Use   Vaping Use: Never used  Substance and Sexual Activity   Alcohol use: Not Currently    Comment: RARELY   Drug use: Never   Sexual activity: Not Currently    Birth control/protection: Post-menopausal  Other Topics Concern   Not on file  Social History Narrative   Not on file   Social Determinants of Health   Financial Resource Strain: Low Risk  (05/02/2021)   Overall Financial Resource Strain (CARDIA)    Difficulty of Paying Living Expenses: Not very hard  Food Insecurity: No Food Insecurity (02/13/2022)   Hunger Vital Sign    Worried About Running Out of Food in the Last  Year: Never true    Ran Out of Food in the Last Year: Never true  Transportation Needs: Unmet Transportation Needs (08/27/2021)   PRAPARE - Administrator, Civil Service (Medical): Yes    Lack of Transportation (Non-Medical): Yes  Physical Activity: Not on file  Stress: Not on file  Social Connections: Not on file  Intimate Partner Violence: Not on file    Review of Systems: ROS negative except for what is noted on the assessment and plan.  Objective:   Vitals:   02/13/22 1001  BP: 128/73  Pulse: 86  Resp: (!) 28  Temp: 98.4 F (36.9 C)  TempSrc: Oral  SpO2: 100%  Weight: 226 lb 12.8 oz (102.9 kg)  Height: 5\' 5"  (1.651 m)    Physical Exam: Constitutional: well-appearing woman sitting in chair, in no acute distress HENT: normocephalic atraumatic, mucous membranes moist Eyes: conjunctiva non-erythematous Neck: supple. No masses, nodules, or enlargement of thyroid gland noted Cardiovascular: regular rate and rhythm, no m/r/g Pulmonary/Chest: normal work of breathing on room air, lungs clear to auscultation bilaterally Abdominal: soft, non-tender, non-distended MSK: normal bulk and tone Neurological: alert & oriented x 3, 5/5 strength in bilateral upper and lower extremities, normal gait Skin: warm and dry Psych: normal mood and affect     Assessment & Plan:   Type 2 diabetes mellitus without complication, without long-term current use of  insulin (HCC) Last A1c 7.1.  Last BMP with creatinine of 0.55, GFR.  No urine microalbumin creatinine ratio.  Current therapy includes Jardiance 25 mg daily and Sitagliptin-metformin 50-1000 twice daily. No hypoglycemic episodes. Patient is currently working on dietary habits, though is been hard to exercise because the cold weather.   Foot exam today significant for hallus valgus (bunion) deformities bilaterally on R>L. Normal microfilament and vibratory exam otherwise.  Plan -A1c today -urine microabumin/Cr  ratio -Continue on current regimen otherwise  Hyperlipidemia Last lipid panel 11/05/21.  Total cholesterol 193, LDL 125.ASCVD risk for this patient with diabetes is 8.6%. patient has not been taking Crestor as she did not understand what this was for. Refilled medication. -Continue on Atorvastatin 20 mg  Hypertension BP wnl on current medication regimen of lisinopril-HCTZ 20-12.5 mg BID. Discussed sodium/salt content in diet. Last BMP wnl. Patient denies hypotension symptoms today -Continue lisinopril-HCTZ 20-12.5 mg BID  Hypothyroidism Patient continues to denies changes in weight, temperature extreme intolerance. She denies fatigue. Reported she had been experiencing some brittle nails and hair loss, but this is improving. Last TSH wnl -Continue synthroid  Healthcare maintenance Flu shot was done at [facility]. Reminded about COVID and shingles vaccination  Patient is in need of orange card reapplication; currently working with Ms. Chilon for referral to dentistry   Patient seen with Dr. Oswaldo Done

## 2022-02-13 NOTE — Assessment & Plan Note (Signed)
BP wnl on current medication regimen of lisinopril-HCTZ 20-12.5 mg BID. Discussed sodium/salt content in diet. Last BMP wnl. Patient denies hypotension symptoms today -Continue lisinopril-HCTZ 20-12.5 mg BID

## 2022-02-13 NOTE — Assessment & Plan Note (Addendum)
Flu shot was done at [facility]. Reminded about COVID and shingles vaccination  Patient is in need of orange card reapplication; currently working with Ms. Chilon for referral to dentistry

## 2022-02-13 NOTE — Assessment & Plan Note (Signed)
Last lipid panel 11/05/21.  Total cholesterol 193, LDL 125.ASCVD risk for this patient with diabetes is 8.6%. patient has not been taking Crestor as she did not understand what this was for. Refilled medication. -Continue on Atorvastatin 20 mg

## 2022-02-13 NOTE — Assessment & Plan Note (Signed)
Patient continues to denies changes in weight, temperature extreme intolerance. She denies fatigue. Reported she had been experiencing some brittle nails and hair loss, but this is improving. Last TSH wnl -Continue synthroid 

## 2022-02-14 LAB — HEMOGLOBIN A1C
Est. average glucose Bld gHb Est-mCnc: 166 mg/dL
Hgb A1c MFr Bld: 7.4 % — ABNORMAL HIGH (ref 4.8–5.6)

## 2022-02-14 LAB — MICROALBUMIN / CREATININE URINE RATIO
Creatinine, Urine: 42.1 mg/dL
Microalb/Creat Ratio: 9 mg/g creat (ref 0–29)
Microalbumin, Urine: 3.7 ug/mL

## 2022-02-14 NOTE — Progress Notes (Signed)
Internal Medicine Clinic Attending  I saw and evaluated the patient.  I personally confirmed the key portions of the history and exam documented by Dr. Gomez-Caraballo and I reviewed pertinent patient test results.  The assessment, diagnosis, and plan were formulated together and I agree with the documentation in the resident's note.  

## 2022-02-15 NOTE — Progress Notes (Signed)
Patient aware; she will continue working on lifestyle modifications as she continues medical therapy

## 2022-03-18 ENCOUNTER — Other Ambulatory Visit (HOSPITAL_COMMUNITY): Payer: Self-pay

## 2022-03-19 ENCOUNTER — Other Ambulatory Visit: Payer: Self-pay

## 2022-04-22 ENCOUNTER — Other Ambulatory Visit (HOSPITAL_COMMUNITY): Payer: Self-pay

## 2022-04-22 ENCOUNTER — Other Ambulatory Visit: Payer: Self-pay | Admitting: Student

## 2022-04-22 DIAGNOSIS — I10 Essential (primary) hypertension: Secondary | ICD-10-CM

## 2022-04-22 MED ORDER — LISINOPRIL-HYDROCHLOROTHIAZIDE 20-12.5 MG PO TABS
2.0000 | ORAL_TABLET | Freq: Every day | ORAL | 2 refills | Status: DC
  Filled 2022-04-22: qty 60, 30d supply, fill #0
  Filled 2022-05-15: qty 60, 30d supply, fill #1
  Filled 2022-07-02: qty 60, 30d supply, fill #2

## 2022-04-25 ENCOUNTER — Other Ambulatory Visit (HOSPITAL_COMMUNITY): Payer: Self-pay

## 2022-05-15 ENCOUNTER — Other Ambulatory Visit: Payer: Self-pay

## 2022-05-15 ENCOUNTER — Other Ambulatory Visit: Payer: Self-pay | Admitting: Student

## 2022-05-15 DIAGNOSIS — E039 Hypothyroidism, unspecified: Secondary | ICD-10-CM

## 2022-05-15 DIAGNOSIS — I10 Essential (primary) hypertension: Secondary | ICD-10-CM

## 2022-05-19 ENCOUNTER — Other Ambulatory Visit (HOSPITAL_COMMUNITY): Payer: Self-pay

## 2022-05-19 MED ORDER — JANUMET 50-1000 MG PO TABS
1.0000 | ORAL_TABLET | Freq: Two times a day (BID) | ORAL | 3 refills | Status: DC
  Filled 2022-05-19: qty 60, 30d supply, fill #0
  Filled 2022-07-02: qty 60, 30d supply, fill #1
  Filled 2022-08-11: qty 60, 30d supply, fill #2
  Filled 2022-09-08: qty 60, 30d supply, fill #3

## 2022-05-19 MED ORDER — LEVOTHYROXINE SODIUM 25 MCG PO TABS
25.0000 ug | ORAL_TABLET | Freq: Every day | ORAL | 2 refills | Status: DC
  Filled 2022-05-19: qty 30, 30d supply, fill #0
  Filled 2022-07-02: qty 30, 30d supply, fill #1
  Filled 2022-08-11: qty 30, 30d supply, fill #2

## 2022-07-02 ENCOUNTER — Other Ambulatory Visit (HOSPITAL_COMMUNITY): Payer: Self-pay

## 2022-08-11 ENCOUNTER — Other Ambulatory Visit (HOSPITAL_COMMUNITY): Payer: Self-pay

## 2022-08-11 ENCOUNTER — Other Ambulatory Visit: Payer: Self-pay | Admitting: Student

## 2022-08-11 DIAGNOSIS — I10 Essential (primary) hypertension: Secondary | ICD-10-CM

## 2022-08-12 ENCOUNTER — Other Ambulatory Visit (HOSPITAL_COMMUNITY): Payer: Self-pay

## 2022-08-12 MED ORDER — LISINOPRIL-HYDROCHLOROTHIAZIDE 20-12.5 MG PO TABS
2.0000 | ORAL_TABLET | Freq: Every day | ORAL | 2 refills | Status: DC
  Filled 2022-08-12: qty 60, 30d supply, fill #0
  Filled 2022-09-08: qty 60, 30d supply, fill #1
  Filled 2022-10-05: qty 60, 30d supply, fill #2

## 2022-08-13 ENCOUNTER — Other Ambulatory Visit (HOSPITAL_COMMUNITY): Payer: Self-pay

## 2022-09-08 ENCOUNTER — Other Ambulatory Visit (HOSPITAL_COMMUNITY): Payer: Self-pay

## 2022-09-08 ENCOUNTER — Encounter: Payer: Self-pay | Admitting: *Deleted

## 2022-09-08 ENCOUNTER — Other Ambulatory Visit: Payer: Self-pay | Admitting: Student

## 2022-09-08 ENCOUNTER — Other Ambulatory Visit: Payer: Self-pay

## 2022-09-08 DIAGNOSIS — E039 Hypothyroidism, unspecified: Secondary | ICD-10-CM

## 2022-09-08 MED ORDER — LEVOTHYROXINE SODIUM 25 MCG PO TABS
25.0000 ug | ORAL_TABLET | Freq: Every day | ORAL | 2 refills | Status: AC
Start: 2022-09-08 — End: ?
  Filled 2022-09-08: qty 30, 30d supply, fill #0
  Filled 2022-10-05: qty 30, 30d supply, fill #1
  Filled 2022-11-05: qty 30, 30d supply, fill #2

## 2022-09-08 NOTE — Telephone Encounter (Signed)
LOV 02/2022. My Chart message sent to pt to call our office to schedule an appt.

## 2022-09-09 ENCOUNTER — Other Ambulatory Visit (HOSPITAL_COMMUNITY): Payer: Self-pay

## 2022-09-12 ENCOUNTER — Other Ambulatory Visit (HOSPITAL_COMMUNITY): Payer: Self-pay

## 2022-09-22 ENCOUNTER — Other Ambulatory Visit: Payer: Self-pay

## 2022-09-22 ENCOUNTER — Encounter: Payer: Self-pay | Admitting: Student

## 2022-09-22 ENCOUNTER — Ambulatory Visit: Payer: Self-pay | Admitting: Student

## 2022-09-22 VITALS — BP 120/57 | HR 88 | Temp 98.6°F | Ht 66.54 in | Wt 219.2 lb

## 2022-09-22 DIAGNOSIS — M25512 Pain in left shoulder: Secondary | ICD-10-CM | POA: Insufficient documentation

## 2022-09-22 DIAGNOSIS — G8929 Other chronic pain: Secondary | ICD-10-CM

## 2022-09-22 DIAGNOSIS — E119 Type 2 diabetes mellitus without complications: Secondary | ICD-10-CM

## 2022-09-22 DIAGNOSIS — G6289 Other specified polyneuropathies: Secondary | ICD-10-CM | POA: Insufficient documentation

## 2022-09-22 DIAGNOSIS — E039 Hypothyroidism, unspecified: Secondary | ICD-10-CM

## 2022-09-22 DIAGNOSIS — I10 Essential (primary) hypertension: Secondary | ICD-10-CM

## 2022-09-22 DIAGNOSIS — E785 Hyperlipidemia, unspecified: Secondary | ICD-10-CM

## 2022-09-22 DIAGNOSIS — E1142 Type 2 diabetes mellitus with diabetic polyneuropathy: Secondary | ICD-10-CM

## 2022-09-22 DIAGNOSIS — M25511 Pain in right shoulder: Secondary | ICD-10-CM

## 2022-09-22 DIAGNOSIS — G629 Polyneuropathy, unspecified: Secondary | ICD-10-CM | POA: Insufficient documentation

## 2022-09-22 DIAGNOSIS — Z7984 Long term (current) use of oral hypoglycemic drugs: Secondary | ICD-10-CM

## 2022-09-22 LAB — POCT GLYCOSYLATED HEMOGLOBIN (HGB A1C): Hemoglobin A1C: 6.8 % — AB (ref 4.0–5.6)

## 2022-09-22 LAB — GLUCOSE, CAPILLARY: Glucose-Capillary: 148 mg/dL — ABNORMAL HIGH (ref 70–99)

## 2022-09-22 NOTE — Assessment & Plan Note (Addendum)
A1c 6.8 from 7.4. Continue Janumet and empagliflozin. May be developing some neuropathy as she reports numbness in her fingers but no objective signs on exam today.

## 2022-09-22 NOTE — Assessment & Plan Note (Signed)
Lipid Panel     Component Value Date/Time   CHOL 193 11/05/2021 1017   TRIG 163 (H) 11/05/2021 1017   HDL 39 (L) 11/05/2021 1017   CHOLHDL 4.9 (H) 11/05/2021 1017   LDLCALC 125 (H) 11/05/2021 1017   LABVLDL 29 11/05/2021 1017   Repeat lipid panel today. Continue atorvastatin 20 mg.

## 2022-09-22 NOTE — Assessment & Plan Note (Signed)
Chronic pain of both shoulders ongoing for several months.  History and physical suggestive of a rotator cuff tendinopathy, possibly infraspinatus problem given that the pain seems isolated to external rotation against resistance.  Less likely arthritis with lack of tenderness over the shoulder and no apparent pain on passive range of motion.  Deferring imaging for now.  Provided with shoulder exercises to do at home.

## 2022-09-22 NOTE — Progress Notes (Signed)
    Subjective:  Christina Norton is a 62 y.o. who presents to clinic for the following:  Routine follow-up of diabetes and high blood pressure.  Also having some shoulder pain.  Shoulder pain is bilateral and has been ongoing for many months.  Triggers are not clear.  Shoulder that she sleeps on seems to hurt worse.  Review of Systems  Constitutional:  Positive for malaise/fatigue. Negative for chills, fever and weight loss.  Respiratory:  Negative for cough and shortness of breath.   Musculoskeletal:  Positive for joint pain.   Objective:   Vitals:   09/22/22 1503  BP: (!) 120/57  Pulse: 88  Temp: 98.6 F (37 C)  TempSrc: Oral  SpO2: 99%  Weight: 219 lb 3.2 oz (99.4 kg)  Height: 5' 6.54" (1.69 m)    Physical Exam Well-appearing, no distress Heart rate is normal, rhythm is regular, radial pulses are strong, dorsalis pedis and posterior tibialis pulses intact bilaterally Breathing is regular and unlabored on room air, no wheezing or crackles Abdomen is nontender Skin is warm and dry No tenderness over either shoulder joint, no pain on active range of motion during abduction, flexion, extension, pain elicited during external rotation against resistance, crepitus noted during movement Alert and oriented Pleasant, concordant affect   Assessment & Plan:   Problem List Items Addressed This Visit     Hypothyroidism    Cold inside, feels fine outside. Some fatigue. Some dry skin and issues with hair falling out. On levothyroxine 25 mcg daily. Repeat TSH.      Relevant Orders   TSH   Type 2 diabetes mellitus without complication, without long-term current use of insulin (HCC) - Primary    A1c 6.8 from 7.4. Continue Janumet and empagliflozin. May be developing some neuropathy as she reports numbness in her fingers but no objective signs on exam today.      Relevant Orders   POC Hbg A1C (Completed)   Hypertension    Blood pressure 120/57 today. No dizziness, no  falls at home. On lisinopril-hydrochlorothiazide 20-12.5 daily.       Relevant Orders   BMP8+Anion Gap   Hyperlipidemia    Lipid Panel     Component Value Date/Time   CHOL 193 11/05/2021 1017   TRIG 163 (H) 11/05/2021 1017   HDL 39 (L) 11/05/2021 1017   CHOLHDL 4.9 (H) 11/05/2021 1017   LDLCALC 125 (H) 11/05/2021 1017   LABVLDL 29 11/05/2021 1017   Repeat lipid panel today. Continue atorvastatin 20 mg.      Relevant Orders   Lipid Profile   Neuropathy    Thinking diabetes polyneuropathy but will check B12 level given treatment with metformin.      Relevant Orders   Vitamin B12   Shoulder pain, bilateral    Chronic pain of both shoulders ongoing for several months.  History and physical suggestive of a rotator cuff tendinopathy, possibly infraspinatus problem given that the pain seems isolated to external rotation against resistance.  Less likely arthritis with lack of tenderness over the shoulder and no apparent pain on passive range of motion.  Deferring imaging for now.  Provided with shoulder exercises to do at home.       Return in 3-6 months for routine follow-up.  Patient discussed with Dr. Elige Ko MD 09/22/2022, 5:51 PM  [phone]

## 2022-09-22 NOTE — Assessment & Plan Note (Signed)
Thinking diabetes polyneuropathy but will check B12 level given treatment with metformin.

## 2022-09-22 NOTE — Progress Notes (Deleted)
This is a Psychologist, occupational Note.  The care of the patient was discussed with Dr. Marland Kitchen and the assessment and plan was formulated with their assistance.  Please see their note for official documentation of the patient encounter.   Subjective:   Patient ID: Christina Norton female   DOB: 08-14-1960 62 y.o.   MRN: [id number]  HPI: Christina Norton is a 62 y.o.   For the details of today's visit, please refer to the assessment and plan.   Has noticed more flatulence in morning and night; has noticed some lactose intolerance; 4-6 months ago;   Takes all meds  Sugar improved; bp good today  Polyuria: 1x a night to pee 2 weeks: increased urinate, no burning, no itching, no blood inurine Lunch at 12:30, dinner 8, sometimes eats later Drinks more water but bc of heat  Vision: no changes, 8 meses  Mareo x Headaches sometimes lower neck aches Associates this with arm pain; sharp pain mostly at night     Past Medical History:  Diagnosis Date   Diabetes mellitus without complication (HCC)    Hyperlipidemia    Hypertension    Thyroid disease    Current Outpatient Medications  Medication Sig Dispense Refill   atorvastatin (LIPITOR) 20 MG tablet Take 1 tablet (20 mg total) by mouth daily. 90 tablet 3   clotrimazole (LOTRIMIN) 1 % cream Apply 1 application. topically 2 (two) times daily. (Patient not taking: Reported on 08/27/2021) 30 g 0   empagliflozin (JARDIANCE) 25 MG TABS tablet Take 1 tablet (25 mg total) by mouth daily. 90 tablet 3   levothyroxine (SYNTHROID) 25 MCG tablet Take 1 tablet (25 mcg total) by mouth daily before breakfast. 30 tablet 2   lisinopril-hydrochlorothiazide (ZESTORETIC) 20-12.5 MG tablet Take 2 tablets by mouth daily. 60 tablet 2   sitaGLIPtin-metformin (JANUMET) 50-1000 MG tablet Take 1 tablet by mouth 2 (two) times daily with a meal. 60 tablet 3   No current facility-administered medications for this visit.   Family History  Problem  Relation Age of Onset   Osteoporosis Mother    Social History   Socioeconomic History   Marital status: Divorced    Spouse name: Not on file   Number of children: 3   Years of education: Not on file   Highest education level: Some college, no degree  Occupational History   Not on file  Tobacco Use   Smoking status: Never   Smokeless tobacco: Never  Vaping Use   Vaping status: Never Used  Substance and Sexual Activity   Alcohol use: Not Currently    Comment: RARELY   Drug use: Never   Sexual activity: Not Currently    Birth control/protection: Post-menopausal  Other Topics Concern   Not on file  Social History Narrative   Not on file   Social Determinants of Health   Financial Resource Strain: Low Risk  (05/02/2021)   Overall Financial Resource Strain (CARDIA)    Difficulty of Paying Living Expenses: Not very hard  Food Insecurity: No Food Insecurity (02/13/2022)   Hunger Vital Sign    Worried About Running Out of Food in the Last Year: Never true    Ran Out of Food in the Last Year: Never true  Transportation Needs: Unmet Transportation Needs (08/27/2021)   PRAPARE - Administrator, Civil Service (Medical): Yes    Lack of Transportation (Non-Medical): Yes  Physical Activity: Not on file  Stress: Not on file  Social Connections: Not  on file   Review of Systems: {Review Of Systems:30496} Objective:  Physical Exam: Vitals:   09/22/22 1503  BP: (!) 120/57  Pulse: 88  Temp: 98.6 F (37 C)  TempSrc: Oral  SpO2: 99%  Weight: 219 lb 3.2 oz (99.4 kg)  Height: 5' 6.54" (1.69 m)    Constitutional: NAD, appears comfortable HEENT: Atraumatic, normocephalic. PERRL, anicteric sclera.  Neck: Supple, trachea midline.  Cardiovascular: RRR, no murmurs, rubs, or gallops.  Pulmonary/Chest: CTAB, no wheezes, rales, or rhonchi. No chest wall abnormalities.  Abdominal: Soft, non tender, non distended. +BS.  GU: ***  Extremities: Warm and well perfused. Distal  pulses intact. No edema.  Neurological: A&Ox3, CN II - XII grossly intact.  Skin: No rashes or erythema  Psychiatric: Normal mood and affect  Assessment & Plan:   No problem-specific Assessment & Plan notes found for this encounter.

## 2022-09-22 NOTE — Patient Instructions (Signed)
Remember to bring all of the medications that you take (including over the counter medications and supplements) with you to every clinic visit.  This after visit summary is an important review of tests, referrals, and medication changes that were discussed during your visit. If you have questions or concerns, call [phone]. Outside of clinic business hours, call the main hospital at [phone] and ask the operator for the on-call internal medicine resident.   Marrianne Mood MD 09/22/2022, 4:12 PM

## 2022-09-22 NOTE — Assessment & Plan Note (Signed)
Blood pressure 120/57 today. No dizziness, no falls at home. On lisinopril-hydrochlorothiazide 20-12.5 daily.

## 2022-09-22 NOTE — Assessment & Plan Note (Signed)
Cold inside, feels fine outside. Some fatigue. Some dry skin and issues with hair falling out. On levothyroxine 25 mcg daily. Repeat TSH.

## 2022-09-23 LAB — TSH: TSH: 2.16 u[IU]/mL (ref 0.450–4.500)

## 2022-09-23 LAB — BMP8+ANION GAP
Anion Gap: 16 mmol/L (ref 10.0–18.0)
BUN/Creatinine Ratio: 22 (ref 12–28)
BUN: 13 mg/dL (ref 8–27)
CO2: 21 mmol/L (ref 20–29)
Calcium: 9.8 mg/dL (ref 8.7–10.3)
Chloride: 101 mmol/L (ref 96–106)
Creatinine, Ser: 0.6 mg/dL (ref 0.57–1.00)
Glucose: 125 mg/dL — ABNORMAL HIGH (ref 70–99)
Potassium: 3.9 mmol/L (ref 3.5–5.2)
Sodium: 138 mmol/L (ref 134–144)
eGFR: 103 mL/min/{1.73_m2} (ref >59)

## 2022-09-23 LAB — LIPID PANEL
Chol/HDL Ratio: 5.3 ratio — ABNORMAL HIGH (ref 0.0–4.4)
Cholesterol, Total: 247 mg/dL — ABNORMAL HIGH (ref 100–199)
HDL: 47 mg/dL (ref >39)
LDL Chol Calc (NIH): 162 mg/dL — ABNORMAL HIGH (ref 0–99)
Triglycerides: 207 mg/dL — ABNORMAL HIGH (ref 0–149)
VLDL Cholesterol Cal: 38 mg/dL (ref 5–40)

## 2022-09-23 LAB — VITAMIN B12: Vitamin B-12: 588 pg/mL (ref 232–1245)

## 2022-09-23 NOTE — Progress Notes (Signed)
 Internal Medicine Clinic Attending  Case discussed with the resident at the time of the visit.  We reviewed the resident's history and exam and pertinent patient test results.  I agree with the assessment, diagnosis, and plan of care documented in the resident's note.  

## 2022-10-02 ENCOUNTER — Other Ambulatory Visit: Payer: Self-pay

## 2022-10-02 DIAGNOSIS — N6310 Unspecified lump in the right breast, unspecified quadrant: Secondary | ICD-10-CM

## 2022-10-05 ENCOUNTER — Other Ambulatory Visit: Payer: Self-pay | Admitting: Student

## 2022-10-05 ENCOUNTER — Other Ambulatory Visit (HOSPITAL_COMMUNITY): Payer: Self-pay

## 2022-10-05 DIAGNOSIS — I10 Essential (primary) hypertension: Secondary | ICD-10-CM

## 2022-10-06 ENCOUNTER — Other Ambulatory Visit: Payer: Self-pay

## 2022-10-06 ENCOUNTER — Other Ambulatory Visit (HOSPITAL_COMMUNITY): Payer: Self-pay

## 2022-10-06 MED ORDER — JANUMET 50-1000 MG PO TABS
1.0000 | ORAL_TABLET | Freq: Two times a day (BID) | ORAL | 3 refills | Status: DC
Start: 2022-10-06 — End: 2023-01-20
  Filled 2022-10-06: qty 60, 30d supply, fill #0
  Filled 2022-11-05: qty 60, 30d supply, fill #1
  Filled 2022-12-07: qty 60, 30d supply, fill #2
  Filled 2023-01-06 – 2023-01-19 (×6): qty 60, 30d supply, fill #3

## 2022-10-28 ENCOUNTER — Ambulatory Visit
Admission: RE | Admit: 2022-10-28 | Discharge: 2022-10-28 | Disposition: A | Discharge status: Home / Self Care | Payer: Self-pay | Source: Ambulatory Visit | Attending: Obstetrics and Gynecology | Admitting: Obstetrics and Gynecology

## 2022-10-28 ENCOUNTER — Ambulatory Visit
Admission: RE | Admit: 2022-10-28 | Discharge: 2022-10-28 | Disposition: A | Discharge status: Home / Self Care | Payer: No Typology Code available for payment source | Source: Ambulatory Visit | Attending: Obstetrics and Gynecology | Admitting: Obstetrics and Gynecology

## 2022-10-28 ENCOUNTER — Ambulatory Visit: Payer: Self-pay | Admitting: Hematology and Oncology

## 2022-10-28 VITALS — Wt 218.0 lb

## 2022-10-28 DIAGNOSIS — Z1211 Encounter for screening for malignant neoplasm of colon: Secondary | ICD-10-CM

## 2022-10-28 DIAGNOSIS — N6310 Unspecified lump in the right breast, unspecified quadrant: Secondary | ICD-10-CM

## 2022-10-28 NOTE — Patient Instructions (Signed)
Taught Christina Norton about self breast awareness and gave educational materials to take home. Patient did not need a Pap smear today due to last Pap smear was in 08/02/20 per patient. Let her know [facility] will cover Pap smears every 5 years unless has a history of abnormal Pap smears. Referred patient to the [facility] for diagnostic mammogram. Appointment scheduled for 10/28/22. Patient aware of appointment and will be there. Let patient know will follow up with her within the next couple weeks with results. Christina Norton verbalized understanding.  Pascal Lux, NP 11:27 AM

## 2022-10-28 NOTE — Progress Notes (Signed)
[name] is a 62 y.o. female who presents to [facility] clinic today with no complaints. Follow up probably benign right breast mass.    Pap Smear: Pap not smear completed today. Last Pap smear was 08/02/20 and was normal. Per patient has no history of an abnormal Pap smear. Last Pap smear result is available in Epic.   Physical exam: Breasts Breasts symmetrical. No skin abnormalities bilateral breasts. No nipple retraction bilateral breasts. No nipple discharge bilateral breasts. No lymphadenopathy. No lumps palpated bilateral breasts.  MM DIAG BREAST TOMO BILATERAL  Result Date: 08/27/2021 CLINICAL DATA:  62 year old female presenting for annual bilateral mammogram in 1 year follow-up of a probably benign right breast mass. EXAM: DIGITAL DIAGNOSTIC BILATERAL MAMMOGRAM WITH TOMOSYNTHESIS AND CAD; ULTRASOUND RIGHT BREAST LIMITED TECHNIQUE: Bilateral digital diagnostic mammography and breast tomosynthesis was performed. The images were evaluated with computer-aided detection.; Targeted ultrasound examination of the right breast was performed COMPARISON:  Previous exam(s). ACR Breast Density Category b: There are scattered areas of fibroglandular density. FINDINGS: Focal asymmetry in the upper inner right breast is mammographically stable. Otherwise, no new or suspicious findings in either breast. The parenchymal pattern is stable. Targeted ultrasound is performed, showing stable appearance of a ill-defined mass at the 1 o'clock position 4 cm from the nipple. Today it measures 2.6 x 1.7 x 0.7 cm (previously 2.9 x 0.9 x 1.7 cm). IMPRESSION: 1. Stable, probably benign right breast mass/asymmetry. Recommend a final follow-up in 1 year. 2. No mammographic evidence of malignancy on the left. RECOMMENDATION: Bilateral diagnostic mammogram and right breast ultrasound in 1 year. I have discussed the findings and recommendations with the patient. If applicable, a reminder letter will be sent to the  patient regarding the next appointment. BI-RADS CATEGORY  3: Probably benign. Electronically Signed   By: Sande Brothers M.D.   On: 08/27/2021 14:50  MS DIGITAL DIAG TOMO UNI RIGHT  Result Date: 02/25/2021 CLINICAL DATA:  Short-term follow-up for a probably benign right breast asymmetry, initially assessed on 08/23/2020 as a recall from the baseline screening exam. EXAM: DIGITAL DIAGNOSTIC UNILATERAL RIGHT MAMMOGRAM WITH TOMOSYNTHESIS AND CAD; ULTRASOUND RIGHT BREAST LIMITED TECHNIQUE: Right digital diagnostic mammography and breast tomosynthesis was performed. The images were evaluated with computer-aided detection.; Targeted ultrasound examination of the right breast was performed COMPARISON:  Previous exam(s). ACR Breast Density Category b: There are scattered areas of fibroglandular density. FINDINGS: The area of mammographic asymmetry, inner slightly upper aspect of the right breast, is unchanged. There are no defined masses, no other areas of asymmetry, no architectural distortion and no suspicious calcifications. No mammographic change. Targeted right breast ultrasound is performed, showing a heterogeneous lesion at 1 o'clock, 4 cm the nipple, measuring 2.9 x 0.9 x 1.7 cm, without significant change from the prior study and consistent focal fibrocystic change or an apocrine cyst. IMPRESSION: 1. Probably benign area of asymmetry in the right breast corresponding to a 2.9 cm sonographic lesion that is likely a focal fibrocystic change or an apocrine cyst. There has been no significant change in 6 months. Additional short-term follow-up recommended. RECOMMENDATION: 1. Diagnostic mammography and right breast ultrasound in 6 months. I have discussed the findings and recommendations with the patient. If applicable, a reminder letter will be sent to the patient regarding the next appointment. BI-RADS CATEGORY  3: Probably benign. Electronically Signed   By: Amie Portland M.D.   On: 02/25/2021 15:26  MS  DIGITAL DIAG TOMO UNI RIGHT  Result Date: 08/23/2020 CLINICAL DATA:  62 year old female presenting as a recall from baseline screening for possible right breast asymmetry. EXAM: DIGITAL DIAGNOSTIC UNILATERAL RIGHT MAMMOGRAM WITH TOMOSYNTHESIS AND CAD; ULTRASOUND RIGHT BREAST LIMITED TECHNIQUE: Right digital diagnostic mammography and breast tomosynthesis was performed. The images were evaluated with computer-aided detection.; Targeted ultrasound examination of the right breast was performed COMPARISON:  Previous exam(s). ACR Breast Density Category b: There are scattered areas of fibroglandular density. FINDINGS: Mammogram: Spot compression tomosynthesis views of the right breast performed demonstrating persistence of an asymmetry in the upper inner right breast best seen on the spot cc view measuring approximately 4.1 cm. Ultrasound: Targeted ultrasound performed throughout the upper inner quadrant of the right breast demonstrating an area of probable a poker in metaplasia at 1 o'clock 4 cm from the nipple measuring 2.7 x 1.1 x 1.7 cm, which likely accounts for a majority of the asymmetry seen mammographically. There is no suspicious solid mass. IMPRESSION: Probably benign findings in the upper inner right breast. RECOMMENDATION: Diagnostic right breast mammogram and ultrasound in 6 months. I have discussed the findings and recommendations with the patient who agrees to short-term follow-up. If applicable, a reminder letter will be sent to the patient regarding the next appointment. BI-RADS CATEGORY  3: Probably benign. Electronically Signed   By: Emmaline Kluver M.D.   On: 08/23/2020 15:01  MS DIGITAL SCREENING TOMO BILATERAL  Result Date: 08/06/2020 CLINICAL DATA:  Screening. EXAM: DIGITAL SCREENING BILATERAL MAMMOGRAM WITH TOMOSYNTHESIS AND CAD TECHNIQUE: Bilateral screening digital craniocaudal and mediolateral oblique mammograms were obtained. Bilateral screening digital breast tomosynthesis was  performed. The images were evaluated with computer-aided detection. COMPARISON:  None. ACR Breast Density Category b: There are scattered areas of fibroglandular density. FINDINGS: In the right breast, a possible asymmetry warrants further evaluation. In the left breast, no findings suspicious for malignancy. IMPRESSION: Further evaluation is suggested for possible asymmetry in the right breast. RECOMMENDATION: Diagnostic mammogram and possibly ultrasound of the right breast. (Code:[id number]) The patient will be contacted regarding the findings, and additional imaging will be scheduled. BI-RADS CATEGORY  0: Incomplete. Need additional imaging evaluation and/or prior mammograms for comparison. Electronically Signed   By: Ted Mcalpine M.D.   On: 08/06/2020 18:45         Pelvic/Bimanual Pap is not indicated today    Smoking History: Patient has never smoked and was not referred to quit line.    Patient Navigation: Patient education provided. Access to services provided for patient through BCCCP program. Natale Lay interpreter provided. No transportation provided   Colorectal Cancer Screening: Per patient has never had colonoscopy completed No complaints today. FIT test given.    Breast and Cervical Cancer Risk Assessment: Patient does not have family history of breast cancer, known genetic mutations, or radiation treatment to the chest before age 53. Patient does not have history of cervical dysplasia, immunocompromised, or DES exposure in-utero.  Risk Assessment   No risk assessment data for the current encounter  Risk Scores       08/27/2021   Last edited by: Meryl Dare, CMA   5-year risk: 1.1%   Lifetime risk: 5.9%            A: BCCCP exam without pap smear Follow up of probable benign right breast mass.   P: Referred [facility] to [facility] for a diagnostic mammogram. Appointment scheduled 10/28/2022.  Pascal Lux, NP 10/28/2022  11:26 AM

## 2022-11-05 ENCOUNTER — Other Ambulatory Visit: Payer: Self-pay | Admitting: Student

## 2022-11-05 DIAGNOSIS — I10 Essential (primary) hypertension: Secondary | ICD-10-CM

## 2022-11-06 ENCOUNTER — Other Ambulatory Visit: Payer: Self-pay

## 2022-11-06 ENCOUNTER — Other Ambulatory Visit (HOSPITAL_COMMUNITY): Payer: Self-pay

## 2022-11-06 MED ORDER — LISINOPRIL-HYDROCHLOROTHIAZIDE 20-12.5 MG PO TABS
2.0000 | ORAL_TABLET | Freq: Every day | ORAL | 3 refills | Status: DC
Start: 2022-11-06 — End: 2023-03-17
  Filled 2022-11-06: qty 60, 30d supply, fill #0
  Filled 2022-12-07: qty 60, 30d supply, fill #1
  Filled 2023-01-06: qty 60, 30d supply, fill #2
  Filled 2023-02-15: qty 60, 30d supply, fill #3

## 2022-11-07 ENCOUNTER — Other Ambulatory Visit (HOSPITAL_COMMUNITY): Payer: Self-pay

## 2022-11-08 LAB — FECAL OCCULT BLOOD, IMMUNOCHEMICAL: Fecal Occult Bld: NEGATIVE

## 2022-11-08 LAB — SPECIMEN STATUS REPORT

## 2022-11-11 ENCOUNTER — Other Ambulatory Visit: Payer: Self-pay | Admitting: Hematology and Oncology

## 2022-12-04 ENCOUNTER — Telehealth: Payer: Self-pay

## 2022-12-04 DIAGNOSIS — E119 Type 2 diabetes mellitus without complications: Secondary | ICD-10-CM

## 2022-12-04 NOTE — Telephone Encounter (Signed)
Jardiance and Januvia are no longer on IM program.  Mailing BI Cares (Jardiance) and MERCK (Januvia) applications to patients home for assistance.

## 2022-12-07 ENCOUNTER — Other Ambulatory Visit: Payer: Self-pay | Admitting: Student

## 2022-12-07 DIAGNOSIS — E039 Hypothyroidism, unspecified: Secondary | ICD-10-CM

## 2022-12-08 ENCOUNTER — Other Ambulatory Visit: Payer: Self-pay

## 2022-12-08 ENCOUNTER — Other Ambulatory Visit (HOSPITAL_COMMUNITY): Payer: Self-pay

## 2022-12-08 MED ORDER — LEVOTHYROXINE SODIUM 25 MCG PO TABS
25.0000 ug | ORAL_TABLET | Freq: Every day | ORAL | 2 refills | Status: DC
  Filled 2022-12-08: qty 30, 30d supply, fill #0
  Filled 2023-01-06: qty 30, 30d supply, fill #1
  Filled 2023-02-15: qty 30, 30d supply, fill #2

## 2022-12-10 NOTE — Telephone Encounter (Signed)
Correction, PAP for Jardiance and Janumet.

## 2022-12-12 ENCOUNTER — Other Ambulatory Visit (HOSPITAL_COMMUNITY): Payer: Self-pay

## 2023-01-06 ENCOUNTER — Other Ambulatory Visit (HOSPITAL_COMMUNITY): Payer: Self-pay

## 2023-01-06 ENCOUNTER — Other Ambulatory Visit: Payer: Self-pay

## 2023-01-07 ENCOUNTER — Other Ambulatory Visit (HOSPITAL_COMMUNITY): Payer: Self-pay

## 2023-01-16 ENCOUNTER — Other Ambulatory Visit (HOSPITAL_COMMUNITY): Payer: Self-pay

## 2023-01-17 ENCOUNTER — Other Ambulatory Visit (HOSPITAL_COMMUNITY): Payer: Self-pay

## 2023-01-19 ENCOUNTER — Other Ambulatory Visit (HOSPITAL_COMMUNITY): Payer: Self-pay

## 2023-01-19 NOTE — Telephone Encounter (Signed)
Patient still hasn't returned patient assistance application (another copy has been given to pharmacy for patient to complete when she comes to pickup her meds).   Pharmacy able to fill the Jardiance using a voucher. They are unable to fill the Janumet. Any suggestions on med changes while we await assistance?

## 2023-01-20 ENCOUNTER — Other Ambulatory Visit: Payer: Self-pay

## 2023-01-20 ENCOUNTER — Other Ambulatory Visit (HOSPITAL_COMMUNITY): Payer: Self-pay

## 2023-01-20 DIAGNOSIS — I10 Essential (primary) hypertension: Secondary | ICD-10-CM

## 2023-01-20 MED ORDER — JANUMET 50-1000 MG PO TABS
1.0000 | ORAL_TABLET | Freq: Two times a day (BID) | ORAL | 3 refills | Status: DC
  Filled 2023-01-20: qty 60, 30d supply, fill #0

## 2023-01-20 MED ORDER — JANUMET 50-1000 MG PO TABS
1.0000 | ORAL_TABLET | Freq: Two times a day (BID) | ORAL | 3 refills | Status: DC
  Filled 2023-01-27: qty 60, 30d supply, fill #0

## 2023-01-20 NOTE — Progress Notes (Signed)
Patients daughter called requesting a rx refill . Patient last seen 7/15 patient is scheduled to return 12/4.

## 2023-01-21 NOTE — Telephone Encounter (Signed)
Rec'd completed patient portions of MERCK and BI CARES application.

## 2023-01-21 NOTE — Progress Notes (Addendum)
Pharmacy Medication Assistance Program Note    02/27/2023  Patient ID: Christina Norton, female   DOB: Mar 28, 1960, 62 y.o.   MRN: [id number]       01/21/2023  Outreach Medication Two  Manufacturer Medication Two [employer]  [employer] Drugs Jardiance  Dose of Jardiance 25MG   Type of Radiographer, therapeutic Assistance  Date Application Sent to Prescriber 01/21/2023  Name of Prescriber Reymundo Poll  Date Application Received From Patient 01/20/2023  Application Items Received From Patient Application  Date Application Submitted to Manufacturer 02/11/2023  Patient Assistance Determination Approved  Approval Start Date 02/26/2023     Siri Cole, CphT Pharmacy Patient Advocate

## 2023-01-26 ENCOUNTER — Other Ambulatory Visit (HOSPITAL_COMMUNITY): Payer: Self-pay

## 2023-01-26 MED ORDER — METFORMIN HCL 1000 MG PO TABS
1000.0000 mg | ORAL_TABLET | Freq: Two times a day (BID) | ORAL | 3 refills | Status: DC
  Filled 2023-01-26: qty 60, 30d supply, fill #0

## 2023-01-26 NOTE — Telephone Encounter (Signed)
Patient assistance is pending for both janumet and jardiance. She is uninsured and cannot pickup janumet that was sent to [facility].   Could the metformin be sent to [facility] pharmacy in the meantime?

## 2023-01-26 NOTE — Telephone Encounter (Signed)
Sending metformin to [facility] outpatient pharmacy as this person is unable to get Janumet at this time. She can take metformin until patient assistance for Janumet is available to her.  Marrianne Mood MD 01/26/2023, 6:59 PM

## 2023-01-27 ENCOUNTER — Other Ambulatory Visit (HOSPITAL_COMMUNITY): Payer: Self-pay

## 2023-01-28 ENCOUNTER — Other Ambulatory Visit (HOSPITAL_COMMUNITY): Payer: Self-pay

## 2023-02-11 ENCOUNTER — Encounter: Payer: Self-pay | Admitting: Student

## 2023-02-11 ENCOUNTER — Other Ambulatory Visit: Payer: Self-pay

## 2023-02-11 ENCOUNTER — Other Ambulatory Visit (HOSPITAL_COMMUNITY): Payer: Self-pay

## 2023-02-11 ENCOUNTER — Ambulatory Visit (INDEPENDENT_AMBULATORY_CARE_PROVIDER_SITE_OTHER): Payer: Self-pay | Admitting: Student

## 2023-02-11 VITALS — BP 136/80 | HR 89 | Temp 98.3°F | Ht 65.0 in | Wt 216.7 lb

## 2023-02-11 DIAGNOSIS — Z0000 Encounter for general adult medical examination without abnormal findings: Secondary | ICD-10-CM

## 2023-02-11 DIAGNOSIS — E119 Type 2 diabetes mellitus without complications: Secondary | ICD-10-CM

## 2023-02-11 DIAGNOSIS — Z7984 Long term (current) use of oral hypoglycemic drugs: Secondary | ICD-10-CM

## 2023-02-11 DIAGNOSIS — M545 Low back pain, unspecified: Secondary | ICD-10-CM

## 2023-02-11 DIAGNOSIS — I10 Essential (primary) hypertension: Secondary | ICD-10-CM

## 2023-02-11 LAB — POCT GLYCOSYLATED HEMOGLOBIN (HGB A1C): Hemoglobin A1C: 7 % — AB (ref 4.0–5.6)

## 2023-02-11 LAB — GLUCOSE, CAPILLARY: Glucose-Capillary: 166 mg/dL — ABNORMAL HIGH (ref 70–99)

## 2023-02-11 MED ORDER — JANUMET 50-1000 MG PO TABS
1.0000 | ORAL_TABLET | Freq: Two times a day (BID) | ORAL | 3 refills | Status: DC
  Filled 2023-02-11 – 2023-02-17 (×4): qty 60, 30d supply, fill #0
  Filled 2023-03-18: qty 60, 30d supply, fill #1
  Filled 2023-06-22 – 2023-08-25 (×3): qty 60, 30d supply, fill #2

## 2023-02-11 MED ORDER — LIDOCAINE HCL 4 % EX CREA
1.0000 | TOPICAL_CREAM | CUTANEOUS | 2 refills | Status: AC | PRN
Start: 2023-02-11 — End: ?
  Filled 2023-02-11: qty 80, fill #0

## 2023-02-11 NOTE — Assessment & Plan Note (Signed)
Colon cancer screening-stool test over the summer 2024 was normal.  Repeat in 2025.

## 2023-02-11 NOTE — Assessment & Plan Note (Signed)
Controlled, 136/80. - Continue lisinopril/hydrochlorothiazide 20-12.5 twice daily

## 2023-02-11 NOTE — Assessment & Plan Note (Signed)
Recurrence of chronic low back pain.  Now it is on the right lateral lumbar area, near the iliac crest.  No signs of infection, not consistent with a stone.  Likely MSK.  Recommend Tylenol or ibuprofen.  Will also give her Bengay.  Discussed continuing regular activities, but limiting heavy lifting, do not want her to be still make the back pain worse.

## 2023-02-11 NOTE — Telephone Encounter (Signed)
Submitted application for [employer] to [employer]) for patient assistance.   Phone: [phone]

## 2023-02-11 NOTE — Progress Notes (Signed)
CC: FU T2DM, HTN  HPI:  [name] is a 62 y.o. female living with a history stated below and presents today for follow up. Please see problem based assessment and plan for additional details.  Past Medical History:  Diagnosis Date   Diabetes mellitus without complication (HCC)    Hyperlipidemia    Hypertension    Thyroid disease     Current Outpatient Medications on File Prior to Visit  Medication Sig Dispense Refill   atorvastatin (LIPITOR) 20 MG tablet Take 1 tablet (20 mg total) by mouth daily. 90 tablet 3   empagliflozin (JARDIANCE) 25 MG TABS tablet Take 1 tablet (25 mg total) by mouth daily. 90 tablet 3   levothyroxine (SYNTHROID) 25 MCG tablet Take 1 tablet (25 mcg total) by mouth daily before breakfast. 30 tablet 2   lisinopril-hydrochlorothiazide (ZESTORETIC) 20-12.5 MG tablet Take 2 tablets by mouth daily. 60 tablet 3   No current facility-administered medications on file prior to visit.    Family History  Problem Relation Age of Onset   Osteoporosis Mother     Social History   Socioeconomic History   Marital status: Divorced    Spouse name: Not on file   Number of children: 3   Years of education: Not on file   Highest education level: Some college, no degree  Occupational History   Not on file  Tobacco Use   Smoking status: Never   Smokeless tobacco: Never  Vaping Use   Vaping status: Never Used  Substance and Sexual Activity   Alcohol use: Not Currently    Comment: RARELY   Drug use: Never   Sexual activity: Not Currently    Birth control/protection: Post-menopausal  Other Topics Concern   Not on file  Social History Narrative   Not on file   Social Determinants of Health   Financial Resource Strain: Low Risk  (05/02/2021)   Overall Financial Resource Strain (CARDIA)    Difficulty of Paying Living Expenses: Not very hard  Food Insecurity: No Food Insecurity (10/28/2022)   Hunger Vital Sign    Worried About Running Out of  Food in the Last Year: Never true    Ran Out of Food in the Last Year: Never true  Transportation Needs: Unmet Transportation Needs (10/28/2022)   PRAPARE - Administrator, Civil Service (Medical): Yes    Lack of Transportation (Non-Medical): Yes  Physical Activity: Not on file  Stress: Not on file  Social Connections: Not on file  Intimate Partner Violence: Not on file    Review of Systems: ROS negative except for what is noted on the assessment and plan.  Vitals:   02/11/23 0838  BP: 136/80  Pulse: 89  Temp: 98.3 F (36.8 C)  TempSrc: Oral  SpO2: 98%  Weight: 216 lb 11.2 oz (98.3 kg)  Height: 5\' 5"  (1.651 m)    Physical Exam: Constitutional: well-appearing female sitting in chair, in no acute distress HENT: normocephalic atraumatic, mucous membranes moist Eyes: conjunctiva non-erythematous Cardiovascular: regular rate and rhythm, no m/r/g Pulmonary/Chest: normal work of breathing on room air, lungs clear to auscultation bilaterally Abdominal: soft, non-tender, non-distended MSK: normal bulk and tone. No CVA tenderness, no midline back tenderness. Neurological: alert & oriented x 3, no focal deficit Skin: warm and dry Psych: normal mood and behavior  Assessment & Plan:   Patient seen with Dr. Sol Blazing  Type 2 diabetes mellitus without complication, without long-term current use of insulin (HCC) Diabetes under control, A1c  7, slight increase from 6.8.  This may be because she had to stop Janumet and start metformin due to insurance issue.  She did report some dizziness on metformin alone and would like to go back to Janumet.  I will attempt to do this, she recently got insurance through the orange card program. - Continue Janumet 50-1000 twice daily and Jardiance 25 daily - ACR and eye exam referral today  Hypertension Controlled, 136/80. - Continue lisinopril/hydrochlorothiazide 20-12.5 twice daily  Back pain Recurrence of chronic low back pain.  Now it is  on the right lateral lumbar area, near the iliac crest.  No signs of infection, not consistent with a stone.  Likely MSK.  Recommend Tylenol or ibuprofen.  Will also give her Bengay.  Discussed continuing regular activities, but limiting heavy lifting, do not want her to be still make the back pain worse.  Healthcare maintenance Colon cancer screening-stool test over the summer 2024 was normal.  Repeat in 2025.  Visit completed with kind help of tablet vidoe translator.  RTC 6 months for diabetes, HTN. Will likely be due for colon cancer screen (stool test).  Katheran James, D.O. [facility] Internal Medicine, PGY-1 Phone: [phone] Date 02/11/2023 Time 9:31 AM

## 2023-02-11 NOTE — Patient Instructions (Addendum)
Stop metformin, start janumet. If too expensive, call the office and I will give metformin.  For back pain, apply Bengay cream as needed. Take tylenol and ibuprofen as needed.  ~~~  Suspenda metformina, comience con janumet. Si es [employer], llame al [employer] y Financial controller.  Para el dolor de espalda, aplique crema Bengay segn sea necesario. Tome tylenol e ibuprofeno segn sea necesario.

## 2023-02-11 NOTE — Assessment & Plan Note (Addendum)
Diabetes under control, A1c 7, slight increase from 6.8.  This may be because she had to stop Janumet and start metformin due to insurance issue.  She did report some dizziness on metformin alone and would like to go back to Janumet.  I will attempt to do this, she recently got insurance through the orange card program. - Continue Janumet 50-1000 twice daily and Jardiance 25 daily - ACR and eye exam referral today

## 2023-02-15 ENCOUNTER — Other Ambulatory Visit: Payer: Self-pay | Admitting: Student

## 2023-02-15 DIAGNOSIS — E785 Hyperlipidemia, unspecified: Secondary | ICD-10-CM

## 2023-02-16 ENCOUNTER — Other Ambulatory Visit (HOSPITAL_COMMUNITY): Payer: Self-pay

## 2023-02-16 ENCOUNTER — Other Ambulatory Visit: Payer: Self-pay

## 2023-02-16 MED ORDER — ATORVASTATIN CALCIUM 20 MG PO TABS
20.0000 mg | ORAL_TABLET | Freq: Every day | ORAL | 3 refills | Status: DC
  Filled 2023-02-16: qty 30, 30d supply, fill #0
  Filled 2023-03-17: qty 30, 30d supply, fill #1
  Filled 2023-04-17: qty 30, 30d supply, fill #2
  Filled 2023-05-28: qty 30, 30d supply, fill #3
  Filled 2023-06-22: qty 30, 30d supply, fill #4
  Filled 2023-07-22: qty 30, 30d supply, fill #5
  Filled 2023-08-25: qty 30, 30d supply, fill #6
  Filled 2023-09-27: qty 30, 30d supply, fill #7
  Filled 2023-10-26: qty 30, 30d supply, fill #8
  Filled 2023-11-25: qty 30, 30d supply, fill #9
  Filled 2024-01-03: qty 30, 30d supply, fill #10
  Filled 2024-01-27: qty 30, 30d supply, fill #11

## 2023-02-17 ENCOUNTER — Other Ambulatory Visit (HOSPITAL_COMMUNITY): Payer: Self-pay

## 2023-02-17 ENCOUNTER — Other Ambulatory Visit: Payer: Self-pay

## 2023-02-17 LAB — MICROALBUMIN / CREATININE URINE RATIO
Creatinine, Urine: 26.2 mg/dL
Microalb/Creat Ratio: 17 mg/g{creat} (ref 0–29)
Microalbumin, Urine: 4.4 ug/mL

## 2023-02-17 NOTE — Progress Notes (Signed)
 Internal Medicine Clinic Attending  I saw and evaluated the patient.  I personally confirmed the key portions of the history and exam documented by Dr.  Ninfa Meeker  and I reviewed pertinent patient test results.  The assessment, diagnosis, and plan were formulated together and I agree with the documentation in the resident's note.

## 2023-02-18 ENCOUNTER — Other Ambulatory Visit: Payer: Self-pay

## 2023-02-18 NOTE — Telephone Encounter (Signed)
Submitted application for JANUMET to [facility] for patient assistance via MAIL.   Phone: [phone]

## 2023-02-24 ENCOUNTER — Other Ambulatory Visit: Payer: Self-pay | Admitting: Student

## 2023-02-24 ENCOUNTER — Other Ambulatory Visit (HOSPITAL_COMMUNITY): Payer: Self-pay

## 2023-02-24 DIAGNOSIS — E119 Type 2 diabetes mellitus without complications: Secondary | ICD-10-CM

## 2023-02-24 MED ORDER — EMPAGLIFLOZIN 25 MG PO TABS
25.0000 mg | ORAL_TABLET | Freq: Every day | ORAL | 1 refills | Status: DC
Start: 2023-02-24 — End: 2023-11-23
  Filled 2023-02-24 – 2023-02-25 (×2): qty 30, 30d supply, fill #0
  Filled 2023-02-26 – 2023-06-22 (×8): qty 90, 90d supply, fill #0

## 2023-02-26 ENCOUNTER — Other Ambulatory Visit (HOSPITAL_COMMUNITY): Payer: Self-pay

## 2023-02-27 NOTE — Telephone Encounter (Signed)
Received notification from [employer] CARES [employer]) regarding approval for [employer]. Patient assistance approved from 02/26/23 to 02/16/24.  Medication will ship to PATIENTS HOME  Pt ID: [id number]  Company phone: [phone]

## 2023-03-02 ENCOUNTER — Other Ambulatory Visit (HOSPITAL_COMMUNITY): Payer: Self-pay

## 2023-03-17 ENCOUNTER — Other Ambulatory Visit (HOSPITAL_COMMUNITY): Payer: Self-pay

## 2023-03-17 ENCOUNTER — Other Ambulatory Visit: Payer: Self-pay | Admitting: Student

## 2023-03-17 DIAGNOSIS — I10 Essential (primary) hypertension: Secondary | ICD-10-CM

## 2023-03-17 DIAGNOSIS — E039 Hypothyroidism, unspecified: Secondary | ICD-10-CM

## 2023-03-18 ENCOUNTER — Other Ambulatory Visit: Payer: Self-pay

## 2023-03-18 ENCOUNTER — Other Ambulatory Visit (HOSPITAL_COMMUNITY): Payer: Self-pay

## 2023-03-18 MED ORDER — LEVOTHYROXINE SODIUM 25 MCG PO TABS
25.0000 ug | ORAL_TABLET | Freq: Every day | ORAL | 3 refills | Status: DC
Start: 2023-03-18 — End: 2023-07-22
  Filled 2023-03-18: qty 30, 30d supply, fill #0
  Filled 2023-04-17: qty 30, 30d supply, fill #1
  Filled 2023-05-28: qty 30, 30d supply, fill #2
  Filled 2023-06-22: qty 30, 30d supply, fill #3

## 2023-03-18 MED ORDER — LISINOPRIL-HYDROCHLOROTHIAZIDE 20-12.5 MG PO TABS
2.0000 | ORAL_TABLET | Freq: Every day | ORAL | 3 refills | Status: DC
Start: 2023-03-18 — End: 2023-07-22
  Filled 2023-03-18: qty 60, 30d supply, fill #0
  Filled 2023-04-17: qty 60, 30d supply, fill #1
  Filled 2023-05-28: qty 60, 30d supply, fill #2
  Filled 2023-06-22: qty 60, 30d supply, fill #3

## 2023-03-19 ENCOUNTER — Other Ambulatory Visit (HOSPITAL_COMMUNITY): Payer: Self-pay

## 2023-03-19 ENCOUNTER — Encounter (HOSPITAL_COMMUNITY): Payer: Self-pay

## 2023-03-27 NOTE — Progress Notes (Signed)
Pharmacy Medication Assistance Program Note    03/27/2023  Patient ID: Christina Norton, female   DOB: 1961/02/28, 63 y.o.   MRN: [id number]     01/21/2023  Outreach Medication One  Manufacturer Medication One [employer]ck  [employer]ck Drugs Janumet  Dose of Janumet 50-1000MG   Type of Radiographer, therapeutic Assistance  Date Application Sent to Prescriber 01/21/2023  Name of Prescriber Reymundo Poll  Date Application Received From Patient 01/20/2023  Application Items Received From Patient Application  Date Application Submitted to Manufacturer 02/18/2023  Method Application Sent to Manufacturer Mailed  Patient Assistance Determination Approved  Approval Start Date 02/27/2023  Approval End Date 02/26/2024

## 2023-03-30 ENCOUNTER — Other Ambulatory Visit: Payer: Self-pay

## 2023-04-09 ENCOUNTER — Other Ambulatory Visit (HOSPITAL_BASED_OUTPATIENT_CLINIC_OR_DEPARTMENT_OTHER): Payer: Self-pay

## 2023-04-17 ENCOUNTER — Other Ambulatory Visit (HOSPITAL_COMMUNITY): Payer: Self-pay

## 2023-05-28 ENCOUNTER — Other Ambulatory Visit (HOSPITAL_COMMUNITY): Payer: Self-pay

## 2023-06-17 ENCOUNTER — Other Ambulatory Visit: Payer: Self-pay

## 2023-06-17 ENCOUNTER — Other Ambulatory Visit (HOSPITAL_COMMUNITY): Payer: Self-pay

## 2023-06-19 ENCOUNTER — Other Ambulatory Visit (HOSPITAL_COMMUNITY): Payer: Self-pay

## 2023-06-22 ENCOUNTER — Other Ambulatory Visit: Payer: Self-pay

## 2023-06-22 ENCOUNTER — Other Ambulatory Visit (HOSPITAL_COMMUNITY): Payer: Self-pay

## 2023-06-23 ENCOUNTER — Other Ambulatory Visit: Payer: Self-pay

## 2023-06-23 ENCOUNTER — Other Ambulatory Visit (HOSPITAL_COMMUNITY): Payer: Self-pay

## 2023-07-22 ENCOUNTER — Other Ambulatory Visit: Payer: Self-pay | Admitting: Student

## 2023-07-22 DIAGNOSIS — I10 Essential (primary) hypertension: Secondary | ICD-10-CM

## 2023-07-22 DIAGNOSIS — E039 Hypothyroidism, unspecified: Secondary | ICD-10-CM

## 2023-07-23 ENCOUNTER — Other Ambulatory Visit (HOSPITAL_COMMUNITY): Payer: Self-pay

## 2023-07-23 ENCOUNTER — Other Ambulatory Visit: Payer: Self-pay

## 2023-07-24 ENCOUNTER — Other Ambulatory Visit (HOSPITAL_COMMUNITY): Payer: Self-pay

## 2023-07-24 MED ORDER — LEVOTHYROXINE SODIUM 25 MCG PO TABS
25.0000 ug | ORAL_TABLET | Freq: Every day | ORAL | 3 refills | Status: DC
Start: 2023-07-24 — End: 2023-11-25
  Filled 2023-07-24: qty 30, 30d supply, fill #0
  Filled 2023-08-25: qty 30, 30d supply, fill #1
  Filled 2023-09-27: qty 30, 30d supply, fill #2
  Filled 2023-10-26: qty 30, 30d supply, fill #3

## 2023-07-24 MED ORDER — LISINOPRIL-HYDROCHLOROTHIAZIDE 20-12.5 MG PO TABS
2.0000 | ORAL_TABLET | Freq: Every day | ORAL | 3 refills | Status: DC
Start: 2023-07-24 — End: 2023-11-25
  Filled 2023-07-24: qty 60, 30d supply, fill #0
  Filled 2023-08-25: qty 60, 30d supply, fill #1
  Filled 2023-09-27: qty 60, 30d supply, fill #2
  Filled 2023-10-26: qty 60, 30d supply, fill #3

## 2023-07-24 NOTE — Telephone Encounter (Signed)
 Refilled, needs follow-up with me before end of June.

## 2023-08-25 ENCOUNTER — Other Ambulatory Visit (HOSPITAL_COMMUNITY): Payer: Self-pay

## 2023-08-25 ENCOUNTER — Other Ambulatory Visit: Payer: Self-pay

## 2023-08-31 ENCOUNTER — Other Ambulatory Visit (HOSPITAL_COMMUNITY): Payer: Self-pay

## 2023-09-28 ENCOUNTER — Other Ambulatory Visit (HOSPITAL_COMMUNITY): Payer: Self-pay

## 2023-10-01 ENCOUNTER — Other Ambulatory Visit (HOSPITAL_COMMUNITY): Payer: Self-pay

## 2023-10-19 ENCOUNTER — Other Ambulatory Visit: Payer: Self-pay | Admitting: Student

## 2023-10-19 DIAGNOSIS — Z1231 Encounter for screening mammogram for malignant neoplasm of breast: Secondary | ICD-10-CM

## 2023-10-21 ENCOUNTER — Ambulatory Visit: Payer: Self-pay | Admitting: Student

## 2023-10-21 VITALS — BP 130/74 | HR 83 | Temp 98.5°F | Ht 65.0 in | Wt 214.0 lb

## 2023-10-21 DIAGNOSIS — E785 Hyperlipidemia, unspecified: Secondary | ICD-10-CM

## 2023-10-21 DIAGNOSIS — G629 Polyneuropathy, unspecified: Secondary | ICD-10-CM

## 2023-10-21 DIAGNOSIS — Z1211 Encounter for screening for malignant neoplasm of colon: Secondary | ICD-10-CM

## 2023-10-21 DIAGNOSIS — Z0000 Encounter for general adult medical examination without abnormal findings: Secondary | ICD-10-CM

## 2023-10-21 DIAGNOSIS — E66812 Obesity, class 2: Secondary | ICD-10-CM

## 2023-10-21 DIAGNOSIS — Z79890 Hormone replacement therapy (postmenopausal): Secondary | ICD-10-CM

## 2023-10-21 DIAGNOSIS — I10 Essential (primary) hypertension: Secondary | ICD-10-CM

## 2023-10-21 DIAGNOSIS — Z6835 Body mass index (BMI) 35.0-35.9, adult: Secondary | ICD-10-CM

## 2023-10-21 DIAGNOSIS — E039 Hypothyroidism, unspecified: Secondary | ICD-10-CM

## 2023-10-21 DIAGNOSIS — E119 Type 2 diabetes mellitus without complications: Secondary | ICD-10-CM

## 2023-10-21 DIAGNOSIS — E1142 Type 2 diabetes mellitus with diabetic polyneuropathy: Secondary | ICD-10-CM

## 2023-10-21 DIAGNOSIS — Z79899 Other long term (current) drug therapy: Secondary | ICD-10-CM

## 2023-10-21 DIAGNOSIS — Z7984 Long term (current) use of oral hypoglycemic drugs: Secondary | ICD-10-CM

## 2023-10-21 LAB — GLUCOSE, CAPILLARY: Glucose-Capillary: 113 mg/dL — ABNORMAL HIGH (ref 70–99)

## 2023-10-21 LAB — POCT GLYCOSYLATED HEMOGLOBIN (HGB A1C): Hemoglobin A1C: 6.4 % — AB (ref 4.0–5.6)

## 2023-10-21 NOTE — Assessment & Plan Note (Signed)
 Chronic, at upper limit of her goal in clinic today.  BP Readings from Last 3 Encounters:  10/21/23 130/74  02/11/23 136/80  09/22/22 (!) 120/57   Continue lisinopril -hydrochlorothiazide  20-12.5 mg 2 tablets daily.

## 2023-10-21 NOTE — Assessment & Plan Note (Signed)
 Lipid Panel     Component Value Date/Time   CHOL 247 (H) 09/22/2022 1626   TRIG 207 (H) 09/22/2022 1626   HDL 47 09/22/2022 1626   CHOLHDL 5.3 (H) 09/22/2022 1626   LDLCALC 162 (H) 09/22/2022 1626   LABVLDL 38 09/22/2022 1626   Chronic, mixed hyperlipidemia with LDL not at goal. Repeat lipid panel today. Continue atorvastatin  20 mg.

## 2023-10-21 NOTE — Assessment & Plan Note (Signed)
 Chronic, stable. Affecting fingertips only. Worse at night. Doesn't impair dexterity. 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 Differential includes mild carpal tunnel. Check B12 today as she is on chronic metformin .

## 2023-10-21 NOTE — Patient Instructions (Signed)
 Return in about 3 months (around 01/21/2024) for weight check, diabetes.  Remember to bring all of the medications that you take (including over the counter medications and supplements) with you to every clinic visit.  This after visit summary is an important review of tests, referrals, and medication changes that were discussed during your visit. If you have questions or concerns, call [phone]. Outside of clinic business hours, call the main hospital at [phone] and ask the operator for the on-call internal medicine resident.   Ozell Kung MD 10/21/2023, 9:07 AM

## 2023-10-21 NOTE — Progress Notes (Signed)
 Patient name: Christina Norton Date of birth: [birth year] Date of visit: 10/21/23  Subjective   Christina Norton is here for a periodic preventive medicine exam.  Had an episode of dizziness recently. When she sleeps her hands feel numb.  One episode of dizziness about a week ago, lasted ~10 seconds. She was cooking when it happened. Didn't seem associated with position change. Some aching in the back of the neck in the morning time. No decreased hearing or ear ringing.   Fingertips feel numb mostly at nighttime. No numbness now. Ongoing more than 6 months ago.  No Known Allergies  Current Outpatient Medications  Medication Instructions   atorvastatin  (LIPITOR) 20 mg, Oral, Daily   empagliflozin  (JARDIANCE ) 25 mg, Oral, Daily   levothyroxine  (SYNTHROID ) 25 mcg, Oral, Daily before breakfast   Lidocaine  HCl (BENGAY LIDOCAINE ) 4 % CREA Apply to back pain as needed -  Aplicar sobre el dolor de espalda segn sea necesario.   lisinopril -hydrochlorothiazide  (ZESTORETIC ) 20-12.5 MG tablet 2 tablets, Oral, Daily   sitaGLIPtin -metformin  (JANUMET ) 50-1000 MG tablet 1 tablet, Oral, 2 times daily with meals   Past Medical History:  Diagnosis Date   Diabetes mellitus without complication (HCC)    Hyperlipidemia    Hypertension    Thyroid disease    Past Surgical History:  Procedure Laterality Date   KNEE ARTHROSCOPY     Family History  Problem Relation Age of Onset   Osteoporosis Mother    Social History   Socioeconomic History   Marital status: Divorced    Spouse name: Not on file   Number of children: 3   Years of education: Not on file   Highest education level: Some college, no degree  Occupational History   Not on file  Tobacco Use   Smoking status: Never   Smokeless tobacco: Never  Vaping Use   Vaping status: Never Used  Substance and Sexual Activity   Alcohol use: Not Currently    Comment: RARELY   Drug use: Never   Sexual activity: Not Currently     Birth control/protection: Post-menopausal  Other Topics Concern   Not on file  Social History Narrative   Not on file   Social Drivers of Health   Financial Resource Strain: Low Risk  (05/02/2021)   Overall Financial Resource Strain (CARDIA)    Difficulty of Paying Living Expenses: Not very hard  Food Insecurity: No Food Insecurity (10/28/2022)   Hunger Vital Sign    Worried About Running Out of Food in the Last Year: Never true    Ran Out of Food in the Last Year: Never true  Transportation Needs: Unmet Transportation Needs (10/28/2022)   PRAPARE - Administrator, Civil Service (Medical): Yes    Lack of Transportation (Non-Medical): Yes  Physical Activity: Not on file  Stress: Not on file  Social Connections: Not on file  Intimate Partner Violence: Not on file    Review of Systems  Constitutional:  Negative for weight loss.  HENT:  Negative for hearing loss.   Eyes:        No changes to vision  Respiratory:  Negative for shortness of breath.   Cardiovascular:  Negative for chest pain.  Gastrointestinal:  Negative for abdominal pain and blood in stool.  Genitourinary:  Negative for hematuria.  Psychiatric/Behavioral:  Negative for depression.      Objective  Today's Vitals   10/21/23 0826  BP: 130/74  Pulse: 83  Temp: 98.5 F (36.9 C)  TempSrc:  Oral  SpO2: 99%  Weight: 214 lb (97.1 kg)  Height: 5' 5 (1.651 m)  Body mass index is 35.61 kg/m.   Physical Exam Constitutional:      General: She is not in acute distress.    Appearance: Normal appearance.  HENT:     Right Ear: Ear canal and external ear normal.     Left Ear: Ear canal and external ear normal.     Ears:     Comments: Bilateral obscuring cerumen    Mouth/Throat:     Mouth: Mucous membranes are moist.     Pharynx: No oropharyngeal exudate or posterior oropharyngeal erythema.  Eyes:     Extraocular Movements: Extraocular movements intact.     Conjunctiva/sclera: Conjunctivae normal.      Pupils: Pupils are equal, round, and reactive to light.  Neck:     Thyroid: No thyroid mass, thyromegaly or thyroid tenderness.     Vascular: No carotid bruit.  Cardiovascular:     Rate and Rhythm: Normal rate and regular rhythm.     Pulses: Normal pulses.     Heart sounds: No murmur heard.    Comments: No femoral bruits Pulmonary:     Effort: Pulmonary effort is normal.     Breath sounds: Normal breath sounds. No wheezing or rales.  Abdominal:     Palpations: Abdomen is soft. There is no hepatomegaly, splenomegaly or mass.     Tenderness: There is no abdominal tenderness.  Musculoskeletal:     Right lower leg: No edema.     Left lower leg: No edema.     Comments: Pain with pain on ROM of R knee, no warmth, erythema, effusion  Lymphadenopathy:     Cervical: No cervical adenopathy.  Skin:    General: Skin is warm and dry.  Neurological:     Mental Status: She is alert. Mental status is at baseline.     Cranial Nerves: No facial asymmetry.     Motor: No tremor.     Deep Tendon Reflexes: Reflexes normal.  Psychiatric:        Mood and Affect: Mood normal.        Behavior: Behavior normal.    Diabetic Foot Exam - Simple   Simple Foot Form Diabetic Foot exam was performed with the following findings: Yes 10/21/2023  4:08 PM  Visual Inspection No deformities, no ulcerations, no other skin breakdown bilaterally: Yes Sensation Testing Intact to touch and monofilament testing bilaterally: Yes Pulse Check Posterior Tibialis and Dorsalis pulse intact bilaterally: Yes Comments        Health Maintenance  Topic Date Due   DTaP/Tdap/Td (1 - Tdap) Never done   Pneumococcal Vaccine: 19-49 Years (1 of 2 - PCV) Never done   Pneumococcal Vaccine: 50+ Years (1 of 2 - PCV) Never done   Zoster Vaccines- Shingrix (1 of 2) Never done   OPHTHALMOLOGY EXAM  04/10/2022   COVID-19 Vaccine (1 - 2024-25 season) Never done   Diabetic kidney evaluation - eGFR measurement  09/22/2023    INFLUENZA VACCINE  10/09/2023   Colonoscopy  02/08/2024 (Originally 07/26/2007)   COLON CANCER SCREENING ANNUAL FOBT  11/06/2023   Diabetic kidney evaluation - Urine ACR  02/11/2024   HEMOGLOBIN A1C  04/22/2024   FOOT EXAM  10/20/2024   MAMMOGRAM  10/27/2024   Cervical Cancer Screening (HPV/Pap Cotest)  08/02/2025   Hepatitis C Screening  Completed   HIV Screening  Completed   Hepatitis B Vaccines  Aged Out  HPV VACCINES  Aged Out   Meningococcal B Vaccine  Aged Out   Assessment & Plan   Wellness examination Assessment & Plan: Up to date on mammogram, Pap smear. Sent FOBT for colon cancer screening. Flu and COVID vaccines would be appropriate this season, as would pneumococcal vaccine (PCV20). She should follow-up with ophthalmology, referral placed.   Type 2 diabetes mellitus without complication, without long-term current use of insulin (HCC) Assessment & Plan: Chronic, A1c at goal.   Lab Results  Component Value Date   HGBA1C 6.4 (A) 10/21/2023   HGBA1C 7.0 (A) 02/11/2023   HGBA1C 6.8 (A) 09/22/2022   Continue jardiance  25 mg daily and janumet  50-1000 mg twice daily.  Orders: -     POCT glycosylated hemoglobin (Hb A1C) -     Glucose, capillary -     Referral to Nutrition and Diabetes Services -     Ambulatory referral to Ophthalmology  Neuropathy Assessment & Plan: Chronic, stable. Affecting fingertips only. Worse at night. Doesn't impair dexterity. 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 Differential includes mild carpal tunnel. Check B12 today as she is on chronic metformin .  Orders: -     Vitamin B12  Hypothyroidism, unspecified type Assessment & Plan: Chronic, stable. Check TSH today. Continue levothyroxine  25 mcg daily.  Orders: -     TSH  Hyperlipidemia, unspecified hyperlipidemia type Assessment & Plan: Lipid Panel     Component Value Date/Time   CHOL 247 (H) 09/22/2022 1626   TRIG 207 (H) 09/22/2022 1626   HDL 47  09/22/2022 1626   CHOLHDL 5.3 (H) 09/22/2022 1626   LDLCALC 162 (H) 09/22/2022 1626   LABVLDL 38 09/22/2022 1626   Chronic, mixed hyperlipidemia with LDL not at goal. Repeat lipid panel today. Continue atorvastatin  20 mg.  Orders: -     Lipid panel  Primary hypertension Assessment & Plan: Chronic, at upper limit of her goal in clinic today.  BP Readings from Last 3 Encounters:  10/21/23 130/74  02/11/23 136/80  09/22/22 (!) 120/57   Continue lisinopril -hydrochlorothiazide  20-12.5 mg 2 tablets daily.  Orders: -     Basic metabolic panel with GFR  Screen for colon cancer -     Fecal occult blood, imunochemical; Future  Class 2 severe obesity due to excess calories with serious comorbidity and body mass index (BMI) of 35.0 to 35.9 in adult [facility]) Assessment & Plan: Body mass index is 35.61 kg/m.  I recommend eliminating sugary beverages like soda, sweet tea, and juice entirely. I recommended more vegetables, lean protein, and legumes. Frozen vegetables are healthy and inexpensive. Beans are a healthy and inexpensive source of lean protein and fiber. I recommend gradually increasing exercise. A daily walk is a great way to start an exercise program.  Referral: to [facility]  Pharmacological intervention: deferred based on shared decision making, she's doing okay with weight loss via healthy diet and exercise.  Orders: -     Referral to Nutrition and Diabetes Services    Return in about 3 months (around 01/21/2024) for weight check, diabetes.  Ozell Kung MD 10/21/2023, 4:16 PM

## 2023-10-21 NOTE — Progress Notes (Signed)
 Video interpretor - spanish  Asuncion [phone] [facility] assisted with communication during lab encounter.[id number] 10-21-2023  D[place], PBT [facility] 10-21-2023

## 2023-10-21 NOTE — Assessment & Plan Note (Addendum)
 Up to date on mammogram, Pap smear. Sent FOBT for colon cancer screening. Flu and COVID vaccines would be appropriate this season, as would pneumococcal vaccine (PCV20). She should follow-up with ophthalmology, referral placed.

## 2023-10-21 NOTE — Assessment & Plan Note (Signed)
 Chronic, A1c at goal.   Lab Results  Component Value Date   HGBA1C 6.4 (A) 10/21/2023   HGBA1C 7.0 (A) 02/11/2023   HGBA1C 6.8 (A) 09/22/2022   Continue jardiance  25 mg daily and janumet  50-1000 mg twice daily.

## 2023-10-21 NOTE — Assessment & Plan Note (Addendum)
 Body mass index is 35.61 kg/m.  I recommend eliminating sugary beverages like soda, sweet tea, and juice entirely. I recommended more vegetables, lean protein, and legumes. Frozen vegetables are healthy and inexpensive. Beans are a healthy and inexpensive source of lean protein and fiber. I recommend gradually increasing exercise. A daily walk is a great way to start an exercise program.  Referral: to [facility]  Pharmacological intervention: deferred based on shared decision making, she's doing okay with weight loss via healthy diet and exercise.

## 2023-10-21 NOTE — Assessment & Plan Note (Signed)
 Chronic, stable. Check TSH today. Continue levothyroxine  25 mcg daily.

## 2023-10-22 ENCOUNTER — Ambulatory Visit: Payer: Self-pay | Admitting: Student

## 2023-10-22 LAB — LIPID PANEL
Chol/HDL Ratio: 3.4 ratio (ref 0.0–4.4)
Cholesterol, Total: 148 mg/dL (ref 100–199)
HDL: 44 mg/dL (ref >39)
LDL Chol Calc (NIH): 83 mg/dL (ref 0–99)
Triglycerides: 119 mg/dL (ref 0–149)
VLDL Cholesterol Cal: 21 mg/dL (ref 5–40)

## 2023-10-22 LAB — BASIC METABOLIC PANEL WITH GFR
BUN/Creatinine Ratio: 25 (ref 12–28)
BUN: 15 mg/dL (ref 8–27)
CO2: 19 mmol/L — ABNORMAL LOW (ref 20–29)
Calcium: 9.8 mg/dL (ref 8.7–10.3)
Chloride: 105 mmol/L (ref 96–106)
Creatinine, Ser: 0.59 mg/dL (ref 0.57–1.00)
Glucose: 110 mg/dL — ABNORMAL HIGH (ref 70–99)
Potassium: 4.2 mmol/L (ref 3.5–5.2)
Sodium: 140 mmol/L (ref 134–144)
eGFR: 102 mL/min/1.73 (ref >59)

## 2023-10-22 LAB — TSH: TSH: 3.46 u[IU]/mL (ref 0.450–4.500)

## 2023-10-22 LAB — FECAL OCCULT BLOOD, IMMUNOCHEMICAL: Fecal Occult Bld: NEGATIVE

## 2023-10-22 LAB — VITAMIN B12: Vitamin B-12: 522 pg/mL (ref 232–1245)

## 2023-10-26 ENCOUNTER — Other Ambulatory Visit (HOSPITAL_COMMUNITY): Payer: Self-pay

## 2023-10-26 NOTE — Progress Notes (Signed)
 Internal Medicine Clinic Attending  Case discussed with the resident at the time of the visit.  We reviewed the resident's history and exam and pertinent patient test results.  I agree with the assessment, diagnosis, and plan of care documented in the resident's note.

## 2023-11-02 ENCOUNTER — Other Ambulatory Visit (HOSPITAL_COMMUNITY): Payer: Self-pay

## 2023-11-02 ENCOUNTER — Other Ambulatory Visit: Payer: Self-pay | Admitting: Obstetrics and Gynecology

## 2023-11-02 DIAGNOSIS — Z1231 Encounter for screening mammogram for malignant neoplasm of breast: Secondary | ICD-10-CM

## 2023-11-03 ENCOUNTER — Ambulatory Visit: Payer: Self-pay

## 2023-11-23 ENCOUNTER — Other Ambulatory Visit: Payer: Self-pay

## 2023-11-23 DIAGNOSIS — E119 Type 2 diabetes mellitus without complications: Secondary | ICD-10-CM

## 2023-11-23 MED ORDER — EMPAGLIFLOZIN 25 MG PO TABS
25.0000 mg | ORAL_TABLET | Freq: Every day | ORAL | 1 refills | Status: AC
Start: 2023-11-23 — End: ?

## 2023-11-23 NOTE — Telephone Encounter (Signed)
 Medication sent to pharmacy

## 2023-11-25 ENCOUNTER — Other Ambulatory Visit: Payer: Self-pay | Admitting: Student

## 2023-11-25 DIAGNOSIS — I10 Essential (primary) hypertension: Secondary | ICD-10-CM

## 2023-11-25 DIAGNOSIS — E039 Hypothyroidism, unspecified: Secondary | ICD-10-CM

## 2023-11-26 ENCOUNTER — Other Ambulatory Visit (HOSPITAL_COMMUNITY): Payer: Self-pay

## 2023-11-26 ENCOUNTER — Other Ambulatory Visit: Payer: Self-pay

## 2023-11-26 MED ORDER — LEVOTHYROXINE SODIUM 25 MCG PO TABS
25.0000 ug | ORAL_TABLET | Freq: Every day | ORAL | 3 refills | Status: DC
  Filled 2023-11-26: qty 30, 30d supply, fill #0
  Filled 2024-01-03: qty 30, 30d supply, fill #1
  Filled 2024-01-27: qty 30, 30d supply, fill #2
  Filled 2024-02-27: qty 30, 30d supply, fill #3

## 2023-11-26 MED ORDER — LISINOPRIL-HYDROCHLOROTHIAZIDE 20-12.5 MG PO TABS
2.0000 | ORAL_TABLET | Freq: Every day | ORAL | 3 refills | Status: DC
  Filled 2023-11-26: qty 60, 30d supply, fill #0
  Filled 2024-01-03: qty 60, 30d supply, fill #1
  Filled 2024-01-27: qty 60, 30d supply, fill #2
  Filled 2024-02-27: qty 60, 30d supply, fill #3

## 2023-11-26 NOTE — Telephone Encounter (Signed)
 Medication sent to pharmacy

## 2023-12-24 ENCOUNTER — Ambulatory Visit: Payer: Self-pay | Admitting: Student

## 2023-12-24 ENCOUNTER — Other Ambulatory Visit (HOSPITAL_COMMUNITY): Payer: Self-pay

## 2023-12-24 ENCOUNTER — Ambulatory Visit: Payer: Self-pay | Admitting: *Deleted

## 2023-12-24 ENCOUNTER — Encounter: Payer: Self-pay | Admitting: Student

## 2023-12-24 ENCOUNTER — Ambulatory Visit: Payer: Self-pay

## 2023-12-24 VITALS — BP 125/73 | Wt 209.0 lb

## 2023-12-24 VITALS — BP 104/70 | HR 101 | Temp 99.1°F | Ht 65.0 in | Wt 210.6 lb

## 2023-12-24 DIAGNOSIS — J029 Acute pharyngitis, unspecified: Secondary | ICD-10-CM

## 2023-12-24 DIAGNOSIS — Z20818 Contact with and (suspected) exposure to other bacterial communicable diseases: Secondary | ICD-10-CM

## 2023-12-24 DIAGNOSIS — Z1239 Encounter for other screening for malignant neoplasm of breast: Secondary | ICD-10-CM

## 2023-12-24 LAB — GROUP A STREP BY PCR: Group A Strep by PCR: DETECTED — AB

## 2023-12-24 MED ORDER — LIDOCAINE VISCOUS HCL 2 % MT SOLN
15.0000 mL | Freq: Four times a day (QID) | OROMUCOSAL | 2 refills | Status: AC | PRN
Start: 2023-12-24 — End: ?
  Filled 2023-12-24: qty 100, 2d supply, fill #0

## 2023-12-24 NOTE — Telephone Encounter (Signed)
 FYI Only or Action Required?: FYI only for provider.  Patient was last seen in primary care on 10/21/2023 by Norrine Sharper, MD.  Called Nurse Triage reporting Fever and Sore Throat.  Symptoms began yesterday.  Interventions attempted: OTC medications: tylenol/advil.  Symptoms are: gradually worsening.  Triage Disposition: See Physician Within 24 Hours  Patient/caregiver understands and will follow disposition?: Yes        Copied from CRM #[id number]. Topic: Clinical - Red Word Triage >> Dec 24, 2023 12:37 PM Christina Norton wrote: Red Word that prompted transfer to Nurse Triage: Patients Daughter is calling on behalf of patient Christina Norton) stating that the patients tonsils are swollen with a fever that started yesterday Reason for Disposition  [1] Pus on tonsils (back of throat) AND [2] fever AND [3] swollen neck lymph nodes (glands)  Answer Assessment - Initial Assessment Questions 1. ONSET: When did the throat start hurting? (Hours or days ago)      yesterday 2. SEVERITY: How bad is the sore throat? (Scale 1-10; mild, moderate or severe)     *No Answer* 3. STREP EXPOSURE: Has there been any exposure to strep within the past week? If Yes, ask: What type of contact occurred?      Yes, daughter is Strep A+ 4.  VIRAL SYMPTOMS: Are there any symptoms of a cold, such as a runny nose, cough, hoarse voice or red eyes?      fever 5. FEVER: Do you have a fever? If Yes, ask: What is your temperature, how was it measured, and when did it start?     102 - tylenol/motrin with relief 6. PUS ON THE TONSILS: Is there pus on the tonsils in the back of your throat?     yes 7. OTHER SYMPTOMS: Do you have any other symptoms? (e.g., difficulty breathing, headache, rash)     Chills. 8. PREGNANCY: Is there any chance you are pregnant? When was your last menstrual period?     N/a  Protocols used: Sore Throat-A-AH

## 2023-12-24 NOTE — Patient Instructions (Addendum)
 I will call you and send an antibiotic if your results are positive for strep throat.  Le llamar y le enviar un antibitico si sus resultados son positivos para Paramedic.

## 2023-12-24 NOTE — Progress Notes (Signed)
   CC: Sore throat  HPI:  Christina Norton is a 63 y.o. female with a PMH stated below who presents today for evaluation.  Please see problem based assessment and plan for additional details.  Past Medical History:  Diagnosis Date   Diabetes mellitus without complication (HCC)    Hyperlipidemia    Hypertension    Thyroid disease     Review of Systems: ROS negative except for what is noted on the assessment and plan.  Vitals:   12/24/23 1426  BP: 104/70  Pulse: (!) 101  Temp: 99.1 F (37.3 C)  TempSrc: Oral  SpO2: 95%  Weight: 210 lb 9.6 oz (95.5 kg)  Height: 5' 5 (1.651 m)   Physical Exam: Constitutional: well-appearing woman in no acute distress HENT: normocephalic atraumatic, mucous membranes moist. Mucous exudates in oropharynx. Swollen lymph node L submandible. Eyes: conjunctiva non-erythematous Cardiovascular: regular rate and rhythm, no m/r/g Pulmonary/Chest: normal work of breathing on room air, lungs clear to auscultation bilaterally Skin: warm and dry  Assessment & Plan:   Patient discussed with Dr. Francesco Assessment & Plan Pharyngitis, unspecified etiology Suspect strep. Daughter in the home has this and is being treated. Pt developed symptoms yesterday. Afebrile here, but report of 102 fever at home. There is cough which argues against. But there are exudates and lymphadenopathy. Centor 2. In any case, the disease is in the household. Not severely ill or in any distress. - Strep throat swap, abx pending - viscous lidocaine  for relief  Lonni Africa, D.O. [facility] Internal Medicine, PGY-2 Phone: [phone] Date 12/24/2023 Time 4:21 PM

## 2023-12-24 NOTE — Patient Instructions (Signed)
 Explained breast self awareness with Eva Melita Blas. Patient did not need a Pap smear today due to last Pap smear and HPV typing was 08/02/2020. Let her know [facility] will cover Pap smears and HPV typing every 5 years unless has a history of abnormal Pap smears. Referred [facility] to the [facility] for a screening mammogram on mobile unit. Appointment scheduled Thursday, December 31, 2023 at 1120. Patient aware of appointment and will be there. Let patient know the [facility] Center will follow up with her within a couple weeks following mammogram with results by letter or phone. Eva Melita Blas verbalized understanding.  Temeka Pore, Wanda Ship, RN 10:38 AM

## 2023-12-24 NOTE — Progress Notes (Signed)
 [name] is a 63 y.o. female who presents to [facility] clinic today with no complaints.    Pap Smear: Pap smear not completed today. Last Pap smear was 08/02/2020 at [facility] clinic and was normal with negative HPV. Per patient has history of an abnormal Pap smear in 2013 that was abnormal with HPV positive. Patient stated she had a colposcopy and cryotherapy completed for follow-up in [place]. Patient stated that she has had at least three normal Pap smears since cryotherapy. Last Pap smear result is available in Epic.    Physical exam: Breasts Breasts symmetrical. No skin abnormalities bilateral breasts. No nipple retraction bilateral breasts. No nipple discharge bilateral breasts. No lymphadenopathy. No lumps palpated bilateral breasts. No complaints of pain or tenderness on exam.       MS 3D DIAG MAMMO BILAT BR (aka MM) Result Date: 10/28/2022 CLINICAL DATA:  Two year follow-up for probably benign RIGHT breast asymmetry/mass. EXAM: DIGITAL DIAGNOSTIC BILATERAL MAMMOGRAM WITH TOMOSYNTHESIS AND CAD; ULTRASOUND RIGHT BREAST LIMITED TECHNIQUE: Bilateral digital diagnostic mammography and breast tomosynthesis was performed. The images were evaluated with computer-aided detection. ; Targeted ultrasound examination of the right breast was performed COMPARISON:  Previous exam(s). ACR Breast Density Category b: There are scattered areas of fibroglandular density. FINDINGS: LEFT breast is negative. There is stable focal asymmetry in the UPPER INNER QUADRANT of the RIGHT breast. No new or suspicious findings. Targeted ultrasound is performed, showing focal mixed echogenicity non mass finding versus mass in the 1 o'clock location RIGHT breast 4 centimeters from the nipple. This area measures 1.5 x 0.8 x 2.0 centimeters and appears stable compared with prior exams. IMPRESSION: No mammographic or ultrasound evidence for malignancy. Stable asymmetry/mass in the RIGHT breast. RECOMMENDATION:  Screening mammogram in one year.(Code:[id number]-01Y) I have discussed the findings and recommendations with the patient with the assistance of an interpreter. If applicable, a reminder letter will be sent to the patient regarding the next appointment. BI-RADS CATEGORY  2: Benign. Electronically Signed   By: Almarie Daring M.D.   On: 10/28/2022 13:28   MM DIAG BREAST TOMO BILATERAL Result Date: 08/27/2021 CLINICAL DATA:  63 year old female presenting for annual bilateral mammogram in 1 year follow-up of a probably benign right breast mass. EXAM: DIGITAL DIAGNOSTIC BILATERAL MAMMOGRAM WITH TOMOSYNTHESIS AND CAD; ULTRASOUND RIGHT BREAST LIMITED TECHNIQUE: Bilateral digital diagnostic mammography and breast tomosynthesis was performed. The images were evaluated with computer-aided detection.; Targeted ultrasound examination of the right breast was performed COMPARISON:  Previous exam(s). ACR Breast Density Category b: There are scattered areas of fibroglandular density. FINDINGS: Focal asymmetry in the upper inner right breast is mammographically stable. Otherwise, no new or suspicious findings in either breast. The parenchymal pattern is stable. Targeted ultrasound is performed, showing stable appearance of a ill-defined mass at the 1 o'clock position 4 cm from the nipple. Today it measures 2.6 x 1.7 x 0.7 cm (previously 2.9 x 0.9 x 1.7 cm). IMPRESSION: 1. Stable, probably benign right breast mass/asymmetry. Recommend a final follow-up in 1 year. 2. No mammographic evidence of malignancy on the left. RECOMMENDATION: Bilateral diagnostic mammogram and right breast ultrasound in 1 year. I have discussed the findings and recommendations with the patient. If applicable, a reminder letter will be sent to the patient regarding the next appointment. BI-RADS CATEGORY  3: Probably benign. Electronically Signed   By: Serena  Chacko M.D.   On: 08/27/2021 14:50  MS DIGITAL DIAG TOMO UNI RIGHT Result Date: 02/25/2021 CLINICAL  DATA:  Short-term follow-up for  a probably benign right breast asymmetry, initially assessed on 08/23/2020 as a recall from the baseline screening exam. EXAM: DIGITAL DIAGNOSTIC UNILATERAL RIGHT MAMMOGRAM WITH TOMOSYNTHESIS AND CAD; ULTRASOUND RIGHT BREAST LIMITED TECHNIQUE: Right digital diagnostic mammography and breast tomosynthesis was performed. The images were evaluated with computer-aided detection.; Targeted ultrasound examination of the right breast was performed COMPARISON:  Previous exam(s). ACR Breast Density Category b: There are scattered areas of fibroglandular density. FINDINGS: The area of mammographic asymmetry, inner slightly upper aspect of the right breast, is unchanged. There are no defined masses, no other areas of asymmetry, no architectural distortion and no suspicious calcifications. No mammographic change. Targeted right breast ultrasound is performed, showing a heterogeneous lesion at 1 o'clock, 4 cm the nipple, measuring 2.9 x 0.9 x 1.7 cm, without significant change from the prior study and consistent focal fibrocystic change or an apocrine cyst. IMPRESSION: 1. Probably benign area of asymmetry in the right breast corresponding to a 2.9 cm sonographic lesion that is likely a focal fibrocystic change or an apocrine cyst. There has been no significant change in 6 months. Additional short-term follow-up recommended. RECOMMENDATION: 1. Diagnostic mammography and right breast ultrasound in 6 months. I have discussed the findings and recommendations with the patient. If applicable, a reminder letter will be sent to the patient regarding the next appointment. BI-RADS CATEGORY  3: Probably benign. Electronically Signed   By: Alm Parkins M.D.   On: 02/25/2021 15:26  MS DIGITAL DIAG TOMO UNI RIGHT Result Date: 08/23/2020 CLINICAL DATA:  63 year old female presenting as a recall from baseline screening for possible right breast asymmetry. EXAM: DIGITAL DIAGNOSTIC UNILATERAL RIGHT MAMMOGRAM  WITH TOMOSYNTHESIS AND CAD; ULTRASOUND RIGHT BREAST LIMITED TECHNIQUE: Right digital diagnostic mammography and breast tomosynthesis was performed. The images were evaluated with computer-aided detection.; Targeted ultrasound examination of the right breast was performed COMPARISON:  Previous exam(s). ACR Breast Density Category b: There are scattered areas of fibroglandular density. FINDINGS: Mammogram: Spot compression tomosynthesis views of the right breast performed demonstrating persistence of an asymmetry in the upper inner right breast best seen on the spot cc view measuring approximately 4.1 cm. Ultrasound: Targeted ultrasound performed throughout the upper inner quadrant of the right breast demonstrating an area of probable a poker in metaplasia at 1 o'clock 4 cm from the nipple measuring 2.7 x 1.1 x 1.7 cm, which likely accounts for a majority of the asymmetry seen mammographically. There is no suspicious solid mass. IMPRESSION: Probably benign findings in the upper inner right breast. RECOMMENDATION: Diagnostic right breast mammogram and ultrasound in 6 months. I have discussed the findings and recommendations with the patient who agrees to short-term follow-up. If applicable, a reminder letter will be sent to the patient regarding the next appointment. BI-RADS CATEGORY  3: Probably benign. Electronically Signed   By: Inocente Ast M.D.   On: 08/23/2020 15:01  MS DIGITAL SCREENING TOMO BILATERAL Result Date: 08/06/2020 CLINICAL DATA:  Screening. EXAM: DIGITAL SCREENING BILATERAL MAMMOGRAM WITH TOMOSYNTHESIS AND CAD TECHNIQUE: Bilateral screening digital craniocaudal and mediolateral oblique mammograms were obtained. Bilateral screening digital breast tomosynthesis was performed. The images were evaluated with computer-aided detection. COMPARISON:  None. ACR Breast Density Category b: There are scattered areas of fibroglandular density. FINDINGS: In the right breast, a possible asymmetry warrants  further evaluation. In the left breast, no findings suspicious for malignancy. IMPRESSION: Further evaluation is suggested for possible asymmetry in the right breast. RECOMMENDATION: Diagnostic mammogram and possibly ultrasound of the right breast. (Code:[id number]) The patient will be contacted  regarding the findings, and additional imaging will be scheduled. BI-RADS CATEGORY  0: Incomplete. Need additional imaging evaluation and/or prior mammograms for comparison. Electronically Signed   By: Dobrinka  Dimitrova M.D.   On: 08/06/2020 18:45    Pelvic/Bimanual Pap is not indicated today per BCCCP guidelines.    Smoking History: Patient has never smoked.   Patient Navigation: Patient education provided. Access to services provided for patient through [place] program. Spanish interpreter Bernice Angry from [facility] provided.   Colorectal Cancer Screening: Per patient has never had colonoscopy completed. Per patient completed a FIT Test given by her PCP 10/21/2023 that was negative. No complaints today.    Breast and Cervical Cancer Risk Assessment: Patient does not have family history of breast cancer, known genetic mutations, or radiation treatment to the chest before age 79. Patient does not have history of cervical dysplasia, immunocompromised, or DES exposure in-utero.  Risk Scores as of Encounter on 12/24/2023     Alisa           5-year 1.33%   Lifetime 6.4%   This patient is [place] but has no documented birth country, so the [place] model used data from [place] patients to calculate their risk score. Document a birth country in the Demographics activity for a more accurate score.         Last calculated by Silas, Ansyi K, CMA on 12/24/2023 at 10:19 AM        A: BCCCP exam without pap smear No complaints.  P: Referred [facility] to the [facility] for a screening mammogram on mobile unit. Appointment scheduled Thursday, December 31, 2023 at 1120.  Driscilla Wanda SQUIBB, RN 12/24/2023 10:38 AM

## 2023-12-25 ENCOUNTER — Other Ambulatory Visit: Payer: Self-pay | Admitting: Student

## 2023-12-25 ENCOUNTER — Ambulatory Visit: Payer: Self-pay | Admitting: Student

## 2023-12-25 ENCOUNTER — Other Ambulatory Visit (HOSPITAL_COMMUNITY): Payer: Self-pay

## 2023-12-25 DIAGNOSIS — J020 Streptococcal pharyngitis: Secondary | ICD-10-CM

## 2023-12-25 MED ORDER — PENICILLIN V POTASSIUM 500 MG PO TABS
500.0000 mg | ORAL_TABLET | Freq: Two times a day (BID) | ORAL | 0 refills | Status: AC
  Filled 2023-12-25: qty 20, 10d supply, fill #0

## 2023-12-25 NOTE — Progress Notes (Signed)
 Internal Medicine Clinic Attending  Case discussed with the resident at the time of the visit.  We reviewed the resident's history and exam and pertinent patient test results.  I agree with the assessment, diagnosis, and plan of care documented in the resident's note.

## 2023-12-31 ENCOUNTER — Inpatient Hospital Stay: Admission: RE | Admit: 2023-12-31 | Payer: Self-pay | Source: Ambulatory Visit

## 2023-12-31 ENCOUNTER — Ambulatory Visit
Admission: RE | Admit: 2023-12-31 | Discharge: 2023-12-31 | Disposition: A | Discharge status: Home / Self Care | Source: Ambulatory Visit | Attending: Anatomic Pathology & Clinical Pathology | Admitting: Anatomic Pathology & Clinical Pathology

## 2023-12-31 DIAGNOSIS — Z1231 Encounter for screening mammogram for malignant neoplasm of breast: Secondary | ICD-10-CM

## 2024-01-04 ENCOUNTER — Other Ambulatory Visit (HOSPITAL_COMMUNITY): Payer: Self-pay

## 2024-01-28 ENCOUNTER — Other Ambulatory Visit (HOSPITAL_COMMUNITY): Payer: Self-pay

## 2024-01-31 IMAGING — MG DIGITAL DIAGNOSTIC BILAT W/ TOMO W/ CAD
8 series · 8 of 24 positions shown · non-contrast
Comparison: Previous exam(s).

CLINICAL DATA: 61-year-old female presenting for annual bilateral
mass.

EXAM:
DIGITAL DIAGNOSTIC BILATERAL MAMMOGRAM WITH TOMOSYNTHESIS AND CAD;
ULTRASOUND RIGHT BREAST LIMITED
TECHNIQUE: Bilateral digital diagnostic mammography and breast tomosynthesis
was performed. The images were evaluated with computer-aided
detection.; Targeted ultrasound examination of the right breast was
performed

[L CC synth-2D]
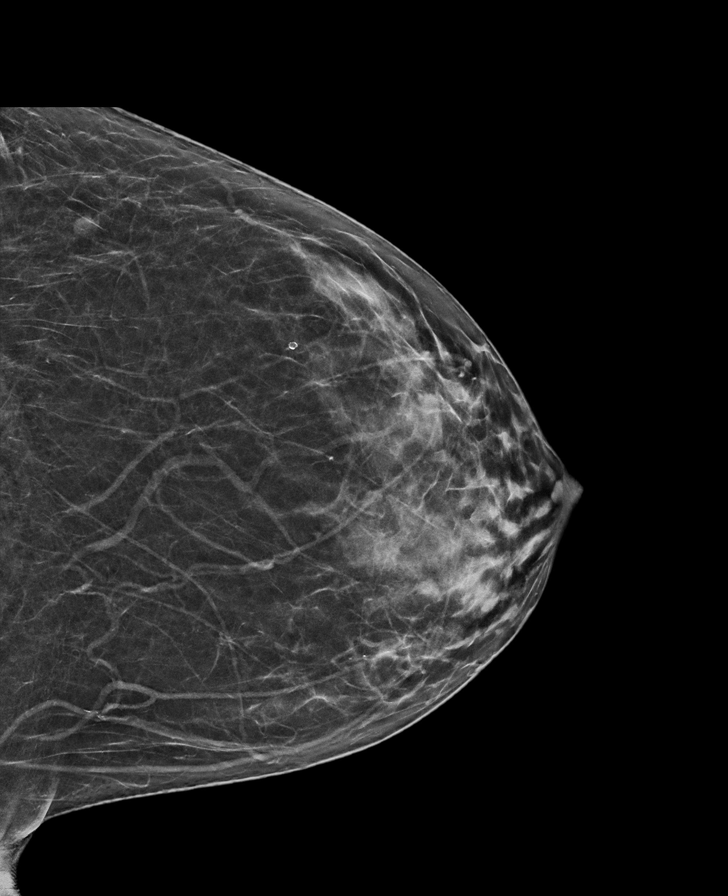

[R MLO synth-2D]
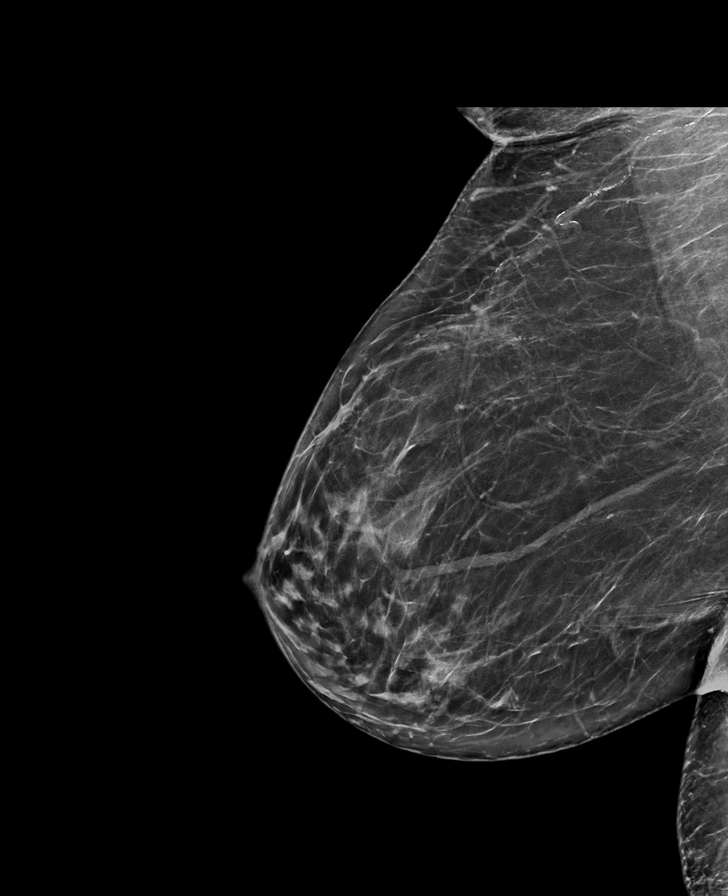

[R CC synth-2D]
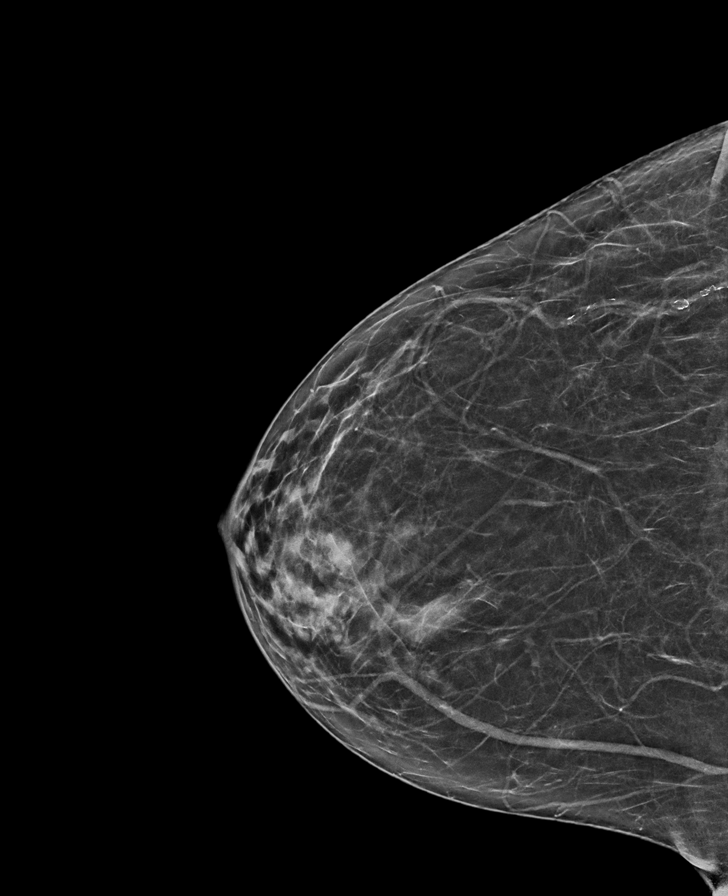

[L MLO synth-2D]
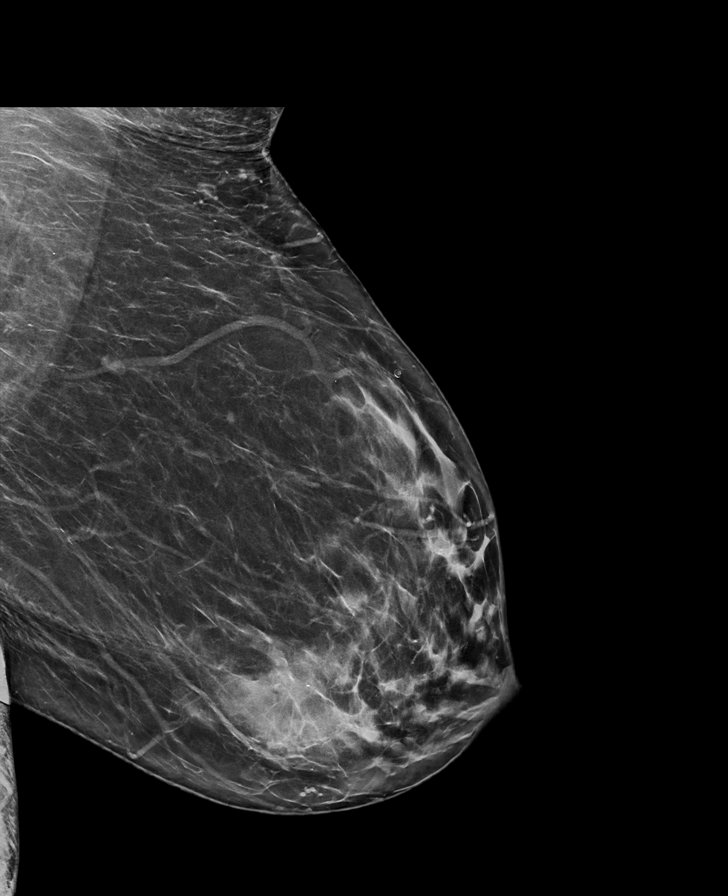

[L CC tomo · tomo slice 31/61.0]
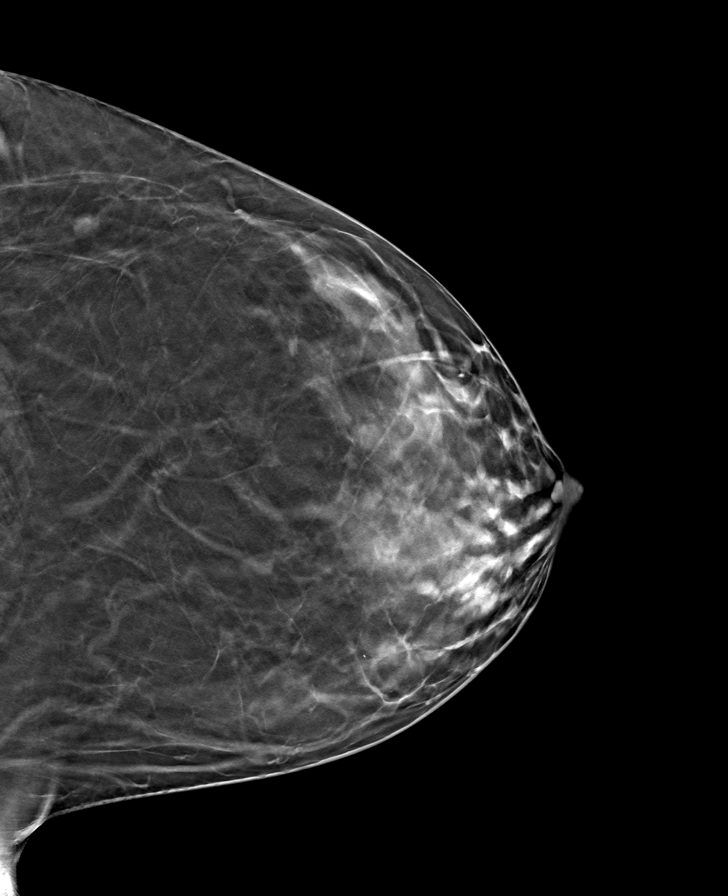

[R MLO tomo · tomo slice 37/73.0]
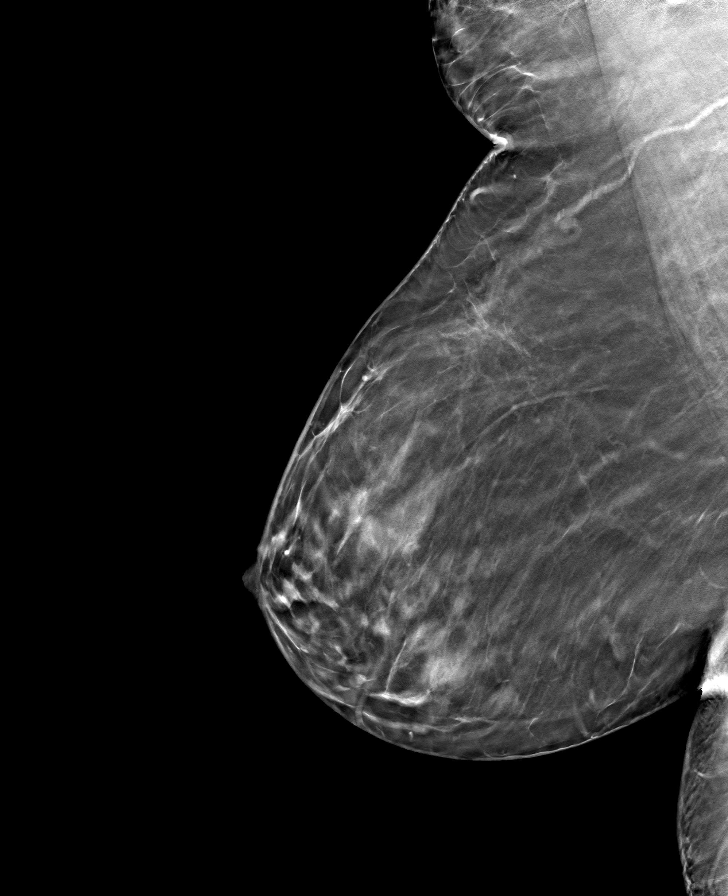

[R CC tomo · tomo slice 32/63.0]
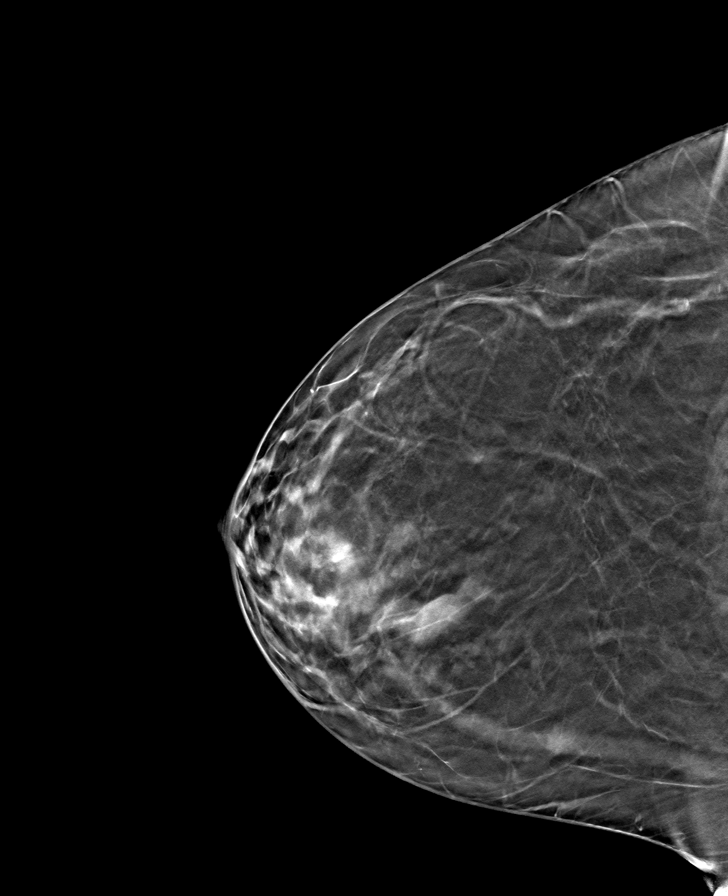

[L MLO tomo · tomo slice 38/75.0]
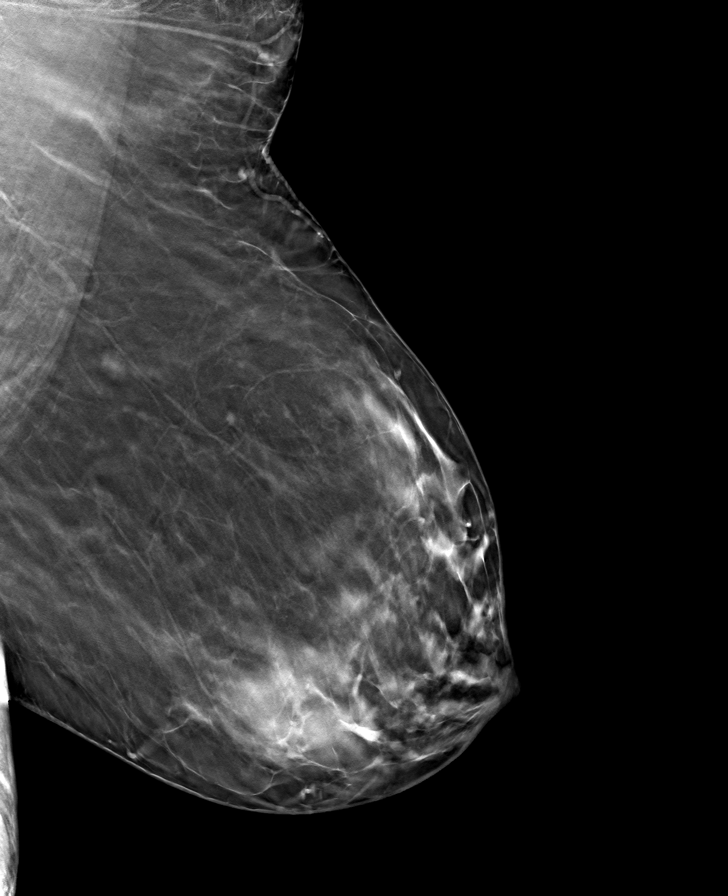

[8 of 24 positions shown; findings below may reference images not displayed]

ACR Breast Density Category b: There are scattered areas of
fibroglandular density.
FINDINGS: Focal asymmetry in the upper inner right breast is mammographically
stable. Otherwise, no new or suspicious findings in either breast.
The parenchymal pattern is stable.

Targeted ultrasound is performed, showing stable appearance of a
ill-defined mass at the 1 o'clock position 4 cm from the nipple.
Today it measures 2.6 x 1.7 x 0.7 cm (previously 2.9 x 0.9 x
cm).
IMPRESSION: 1. Stable, probably benign right breast mass/asymmetry. Recommend a
final follow-up in 1 year.
2. No mammographic evidence of malignancy on the left.

RECOMMENDATION:
Bilateral diagnostic mammogram and right breast ultrasound in 1
year.

I have discussed the findings and recommendations with the patient.
If applicable, a reminder letter will be sent to the patient
regarding the next appointment.

BI-RADS CATEGORY  3: Probably benign.

## 2024-02-23 ENCOUNTER — Other Ambulatory Visit: Payer: Self-pay

## 2024-02-23 ENCOUNTER — Other Ambulatory Visit (HOSPITAL_COMMUNITY): Payer: Self-pay

## 2024-02-23 ENCOUNTER — Ambulatory Visit: Payer: Self-pay

## 2024-02-23 VITALS — BP 120/80 | HR 76 | Temp 98.3°F | Ht 65.0 in | Wt 207.4 lb

## 2024-02-23 DIAGNOSIS — Z7985 Long-term (current) use of injectable non-insulin antidiabetic drugs: Secondary | ICD-10-CM

## 2024-02-23 DIAGNOSIS — Z7984 Long term (current) use of oral hypoglycemic drugs: Secondary | ICD-10-CM

## 2024-02-23 DIAGNOSIS — G5603 Carpal tunnel syndrome, bilateral upper limbs: Secondary | ICD-10-CM | POA: Insufficient documentation

## 2024-02-23 DIAGNOSIS — E119 Type 2 diabetes mellitus without complications: Secondary | ICD-10-CM

## 2024-02-23 LAB — POCT GLYCOSYLATED HEMOGLOBIN (HGB A1C): HbA1c, POC (controlled diabetic range): 6.1 % (ref 0.0–7.0)

## 2024-02-23 LAB — GLUCOSE, CAPILLARY: Glucose-Capillary: 110 mg/dL — ABNORMAL HIGH (ref 70–99)

## 2024-02-23 MED ORDER — TRULICITY 0.75 MG/0.5ML ~~LOC~~ SOAJ
0.7500 mg | SUBCUTANEOUS | 11 refills | Status: AC
  Filled 2024-02-23: qty 2, 28d supply, fill #0
  Filled 2024-03-17 – 2024-03-18 (×2): qty 2, 28d supply, fill #1

## 2024-02-23 NOTE — Progress Notes (Signed)
 Established Patient Office Visit  Subjective   Patient ID: Christina Norton, female    DOB: 1960-11-23  Age: 63 y.o. MRN: [id number]  Chief Complaint  Patient presents with   Follow-up    RIGHT SHOULDER PAIN # 5 / requesting to checked for RSV /   Diabetes   Hypertension   Hyperlipidemia   The patient presented today for a routine DM follow up. See A&P below.   Review of Systems  Constitutional:  Negative for chills, fever and weight loss.  Eyes:  Negative for blurred vision, double vision and pain.  Cardiovascular:  Negative for chest pain, palpitations and leg swelling.  Gastrointestinal:  Negative for diarrhea, heartburn, nausea and vomiting.  Genitourinary:  Negative for dysuria, frequency and urgency.  Neurological:  Positive for tingling. Negative for dizziness, sensory change and headaches.       Tingling and numbness in her hands bilaterally upon wakening in the morning      Objective:     BP 120/80 (BP Location: Left Arm, Patient Position: Sitting, Cuff Size: Large)   Pulse 76   Temp 98.3 F (36.8 C) (Oral)   Ht 5' 5 (1.651 m)   Wt 207 lb 6.4 oz (94.1 kg)   SpO2 96%   BMI 34.51 kg/m    Physical Exam Constitutional:      Appearance: Normal appearance.  HENT:     Head: Normocephalic.  Cardiovascular:     Rate and Rhythm: Normal rate and regular rhythm.  Pulmonary:     Effort: Pulmonary effort is normal. No respiratory distress.     Breath sounds: Normal breath sounds. No wheezing, rhonchi or rales.  Abdominal:     General: There is no distension.     Tenderness: There is no abdominal tenderness.  Musculoskeletal:     Comments: Phalen's test positive bilaterally.   Skin:    General: Skin is warm and dry.  Neurological:     General: No focal deficit present.     Mental Status: She is alert.      Results for orders placed or performed in visit on 02/23/24  Glucose, capillary  Result Value Ref Range   Glucose-Capillary 110 (H) 70 - 99  mg/dL  POC Hbg J8R  Result Value Ref Range   Hemoglobin A1C     HbA1c POC (<> result, manual entry)     HbA1c, POC (prediabetic range)     HbA1c, POC (controlled diabetic range) 6.1 0.0 - 7.0 %    The 10-year ASCVD risk score (Arnett DK, et al., 2019) is: 7.6%    Assessment & Plan:   Assessment & Plan Type 2 diabetes mellitus without complication, without long-term current use of insulin (HCC) The patient presented today for a diabetic follow up. Her A1C is 6.1. Her current regimen is Jardiance  only. She reports no low blood sugars and is feeling very well overall. Due to her BMI and co-existing diabetes, we did discuss addition of a GLP-1 which she was enthusiastic about as she has been wanting to lose weight and has tried multiple diets. This will also help with cardiovascular risk reduction. As she does not have medical insurance coverage, will send trulicity  to [facility] as it is on the [facility] list. The patient is instructed to let us  know if she has difficulty obtaining the medication and also if she experiences intolerable side effects.   Follow up in 4 weeks for side effect management and re-eval of carpal tunnel.  Orders:   POC Hbg A1C   Ambulatory referral to Ophthalmology   Microalbumin / Creatinine Urine Ratio   Dulaglutide  (TRULICITY ) 0.75 MG/0.5ML SOAJ; Inject 0.75 mg into the skin once a week.  Carpal tunnel syndrome, bilateral The patient is complaining of bilateral tingling and numbness, most prominently in her fingertips. This happens most often upon awakening in the mornings as well as when she is painting her canvases. Phalen's test is positive on exam. I recommended the use of wrist splints nightly to prevent symptoms as well as to use them when doing activities that aggravate her, like painting. If this is ineffective, we will escalate management.       Schuyler Novak, DO

## 2024-02-23 NOTE — Assessment & Plan Note (Signed)
 The patient presented today for a diabetic follow up. Her A1C is 6.1. Her current regimen is Jardiance  only. She reports no low blood sugars and is feeling very well overall. Due to her BMI and co-existing diabetes, we did discuss addition of a GLP-1 which she was enthusiastic about as she has been wanting to lose weight and has tried multiple diets. This will also help with cardiovascular risk reduction. As she does not have medical insurance coverage, will send trulicity  to [facility] as it is on the [facility] list. The patient is instructed to let us  know if she has difficulty obtaining the medication and also if she experiences intolerable side effects.   Follow up in 4 weeks for side effect management and re-eval of carpal tunnel.    Orders:   POC Hbg A1C   Ambulatory referral to Ophthalmology   Microalbumin / Creatinine Urine Ratio   Dulaglutide  (TRULICITY ) 0.75 MG/0.5ML SOAJ; Inject 0.75 mg into the skin once a week.

## 2024-02-23 NOTE — Assessment & Plan Note (Signed)
 The patient is complaining of bilateral tingling and numbness, most prominently in her fingertips. This happens most often upon awakening in the mornings as well as when she is painting her canvases. Phalen's test is positive on exam. I recommended the use of wrist splints nightly to prevent symptoms as well as to use them when doing activities that aggravate her, like painting. If this is ineffective, we will escalate management.

## 2024-02-23 NOTE — Patient Instructions (Addendum)
 Thank you, [name], for allowing us  to provide your care today. Today we discussed . . .  > Diabetes and weight loss       - We will send Trulicity  to the [facility].        - Let us  know if there are any side effects or issues getting the medication.  > Carpal Tunnel       - Try to wear wrist splints at night and when doing a lot of activity during the day where it hurts.  I have ordered the following labs for you:  Lab Orders         Glucose, capillary         Microalbumin / Creatinine Urine Ratio         POC Hbg A1C       Referrals ordered today:   Referral Orders         Ambulatory referral to Ophthalmology       Follow up: 4 weeks    Remember:  Should you have any questions or concerns please call the internal medicine clinic at [phone].     Schuyler Novak, DO [facility].

## 2024-02-24 LAB — MICROALBUMIN / CREATININE URINE RATIO
Creatinine, Urine: 16.2 mg/dL
Microalb/Creat Ratio: <19 mg/g{creat} (ref 0–29)
Microalbumin, Urine: <3 ug/mL

## 2024-02-27 ENCOUNTER — Other Ambulatory Visit: Payer: Self-pay | Admitting: Student

## 2024-02-27 ENCOUNTER — Other Ambulatory Visit (HOSPITAL_COMMUNITY): Payer: Self-pay

## 2024-02-27 DIAGNOSIS — E785 Hyperlipidemia, unspecified: Secondary | ICD-10-CM

## 2024-02-29 ENCOUNTER — Other Ambulatory Visit: Payer: Self-pay

## 2024-02-29 ENCOUNTER — Other Ambulatory Visit (HOSPITAL_COMMUNITY): Payer: Self-pay

## 2024-02-29 MED FILL — Atorvastatin Calcium Tab 20 MG (Base Equivalent): 20.0000 mg | ORAL | 30 days supply | Qty: 30 | Fill #0 | Status: AC

## 2024-02-29 NOTE — Telephone Encounter (Signed)
 Medication sent to pharmacy

## 2024-03-14 ENCOUNTER — Other Ambulatory Visit (HOSPITAL_COMMUNITY): Payer: Self-pay

## 2024-03-14 ENCOUNTER — Telehealth: Payer: Self-pay

## 2024-03-14 NOTE — Telephone Encounter (Signed)
 In process of mailing [employer] re-enrollment application for Jardiance  assistance.

## 2024-03-16 NOTE — Telephone Encounter (Signed)
 Rec'd [employer] application from patient for re-enrollment of Januvia  medication.   Per previous chart notes patient taking Jardiance  only. Will contact patient.   PAP: Patient assistance application for Jardiance  through [employer]) has been mailed to pt's home address on file.

## 2024-03-17 ENCOUNTER — Other Ambulatory Visit: Payer: Self-pay

## 2024-03-17 NOTE — Progress Notes (Signed)
 Internal Medicine Clinic Attending  I was physically present during the key portions of the resident provided service and participated in the medical decision making of patient's management care. I reviewed pertinent patient test results.  The assessment, diagnosis, and plan were formulated together and I agree with the documentation in the resident's note.  Jeanelle Layman CROME, MD

## 2024-03-18 ENCOUNTER — Other Ambulatory Visit: Payer: Self-pay

## 2024-03-22 ENCOUNTER — Other Ambulatory Visit: Payer: Self-pay

## 2024-03-22 ENCOUNTER — Telehealth: Payer: Self-pay | Admitting: *Deleted

## 2024-03-22 ENCOUNTER — Ambulatory Visit: Payer: Self-pay | Admitting: Student

## 2024-03-22 NOTE — Telephone Encounter (Signed)
 I talked to pt's daughter who stated pt had never stopped taking Janumet . Instructed pt to take Janumet ,Trulicity , and Jardiance  per Dr Norrine.

## 2024-03-22 NOTE — Telephone Encounter (Signed)
 Pt's here with her daughter,Christina Norton, with about her diabetes medications. She's taking Janumet , Jardiance  and Trulicity . Her med list only has her on Trulicity  and Jardiance . The daughter stated she was not instructed to stop any medications at the last OV. They need clarification on which meds pt should be taking. I informed them to only take Trulicity  and Jardiance  until they hear back from the doctor; also to monitor her BS's.

## 2024-03-22 NOTE — Telephone Encounter (Signed)
 Provider confirmed patient is taking BOTH Jardiance  and Janumet . Will continue [employer] re-enrollment in new encounter

## 2024-03-22 NOTE — Telephone Encounter (Signed)
 Pt confirmed she takes Janumet  50-1000 mg BID.

## 2024-03-22 NOTE — Telephone Encounter (Signed)
 Okay to continue janumet --looks like it was discontinued in December because she hadn't taken it for a while. Can you confirm the dose is still the janumet  50-1000 bid?  Updated medication list.  Current Outpatient Medications  Medication Instructions   Acetaminophen (TYLENOL PO) Take by mouth.   Ascorbic Acid (VITAMIN C PO) Take by mouth.   atorvastatin  (LIPITOR) 20 mg, Oral, Daily   empagliflozin  (JARDIANCE ) 25 mg, Oral, Daily   ibuprofen (ADVIL) 100 mg, Every 6 hours PRN   levothyroxine  (SYNTHROID ) 25 mcg, Oral, Daily before breakfast   lidocaine  (XYLOCAINE ) 2 % solution Use as directed 15 mLs in the mouth or throat every 6 (six) hours as needed for mouth pain.   Lidocaine  HCl (BENGAY LIDOCAINE ) 4 % CREA Apply to back pain as needed -  Aplicar sobre el dolor de espalda segn sea necesario.   lisinopril -hydrochlorothiazide  (ZESTORETIC ) 20-12.5 MG tablet 2 tablets, Oral, Daily   sitaGLIPtin -metformin  (JANUMET ) 50-1000 MG tablet 1 tablet, 2 times daily with meals   Trulicity  0.75 mg, Subcutaneous, Weekly   VITAMIN D PO Take by mouth.   Ozell Kung MD 03/22/2024, 10:43 AM

## 2024-03-23 ENCOUNTER — Telehealth: Payer: Self-pay

## 2024-03-23 NOTE — Telephone Encounter (Signed)
 Received completed [employer] re-enrollment application from patient, via mail.  Application placed in blue teams box for signature.

## 2024-03-30 ENCOUNTER — Other Ambulatory Visit (HOSPITAL_COMMUNITY): Payer: Self-pay

## 2024-03-30 ENCOUNTER — Other Ambulatory Visit: Payer: Self-pay | Admitting: Student

## 2024-03-30 DIAGNOSIS — I10 Essential (primary) hypertension: Secondary | ICD-10-CM

## 2024-03-30 DIAGNOSIS — E039 Hypothyroidism, unspecified: Secondary | ICD-10-CM

## 2024-03-30 MED ORDER — LEVOTHYROXINE SODIUM 25 MCG PO TABS
25.0000 ug | ORAL_TABLET | Freq: Every day | ORAL | 3 refills | Status: AC
  Filled 2024-03-30: qty 30, 30d supply, fill #0

## 2024-03-30 MED ORDER — LISINOPRIL-HYDROCHLOROTHIAZIDE 20-12.5 MG PO TABS
2.0000 | ORAL_TABLET | Freq: Every day | ORAL | 3 refills | Status: AC
  Filled 2024-03-30: qty 60, 30d supply, fill #0

## 2024-03-30 MED FILL — Atorvastatin Calcium Tab 20 MG (Base Equivalent): 20.0000 mg | ORAL | 30 days supply | Qty: 30 | Fill #1 | Status: AC

## 2024-03-30 NOTE — Telephone Encounter (Signed)
 Provider page signed by Dr. Kem.  PAP: re-enrollment application for Janumet  has been submitted to [employer], via fax.

## 2024-03-30 NOTE — Telephone Encounter (Signed)
 Medication sent to pharmacy

## 2024-03-31 ENCOUNTER — Other Ambulatory Visit (HOSPITAL_COMMUNITY): Payer: Self-pay
